# Patient Record
Sex: Male | Born: 1960
Health system: Southern US, Community
[De-identification: ages and names within clinical notes are randomized; demographics above are authoritative.]

## PROBLEM LIST (undated history)

## (undated) DIAGNOSIS — I639 Cerebral infarction, unspecified: Secondary | ICD-10-CM

## (undated) DIAGNOSIS — E119 Type 2 diabetes mellitus without complications: Secondary | ICD-10-CM

## (undated) DIAGNOSIS — I10 Essential (primary) hypertension: Secondary | ICD-10-CM

## (undated) DIAGNOSIS — H5440 Blindness, one eye, unspecified eye: Secondary | ICD-10-CM

## (undated) DIAGNOSIS — H3320 Serous retinal detachment, unspecified eye: Secondary | ICD-10-CM

---

## 2019-02-12 DIAGNOSIS — I517 Cardiomegaly: Secondary | ICD-10-CM | POA: Diagnosis not present

## 2019-02-12 DIAGNOSIS — I10 Essential (primary) hypertension: Secondary | ICD-10-CM

## 2019-02-12 DIAGNOSIS — I639 Cerebral infarction, unspecified: Secondary | ICD-10-CM | POA: Diagnosis not present

## 2019-02-12 DIAGNOSIS — N179 Acute kidney failure, unspecified: Secondary | ICD-10-CM | POA: Diagnosis not present

## 2019-02-12 DIAGNOSIS — E1165 Type 2 diabetes mellitus with hyperglycemia: Secondary | ICD-10-CM

## 2019-02-13 DIAGNOSIS — N179 Acute kidney failure, unspecified: Secondary | ICD-10-CM | POA: Diagnosis not present

## 2019-02-13 DIAGNOSIS — I639 Cerebral infarction, unspecified: Secondary | ICD-10-CM | POA: Diagnosis not present

## 2019-02-13 DIAGNOSIS — E1165 Type 2 diabetes mellitus with hyperglycemia: Secondary | ICD-10-CM | POA: Diagnosis not present

## 2019-02-14 DIAGNOSIS — N179 Acute kidney failure, unspecified: Secondary | ICD-10-CM | POA: Diagnosis not present

## 2019-02-14 DIAGNOSIS — I639 Cerebral infarction, unspecified: Secondary | ICD-10-CM | POA: Diagnosis not present

## 2019-02-14 DIAGNOSIS — E1165 Type 2 diabetes mellitus with hyperglycemia: Secondary | ICD-10-CM | POA: Diagnosis not present

## 2019-03-01 ENCOUNTER — Other Ambulatory Visit (HOSPITAL_COMMUNITY): Payer: Self-pay | Admitting: Interventional Radiology

## 2019-03-01 DIAGNOSIS — I63519 Cerebral infarction due to unspecified occlusion or stenosis of unspecified middle cerebral artery: Secondary | ICD-10-CM

## 2019-03-05 ENCOUNTER — Ambulatory Visit (HOSPITAL_COMMUNITY): Admission: RE | Admit: 2019-03-05 | Payer: No Typology Code available for payment source | Source: Ambulatory Visit

## 2019-03-14 ENCOUNTER — Emergency Department (HOSPITAL_COMMUNITY): Payer: Medicare Other

## 2019-03-14 ENCOUNTER — Other Ambulatory Visit: Payer: Self-pay

## 2019-03-14 ENCOUNTER — Inpatient Hospital Stay (HOSPITAL_COMMUNITY): Payer: Medicare Other

## 2019-03-14 ENCOUNTER — Ambulatory Visit (HOSPITAL_COMMUNITY): Admission: RE | Admit: 2019-03-14 | Payer: No Typology Code available for payment source | Source: Ambulatory Visit

## 2019-03-14 ENCOUNTER — Telehealth (HOSPITAL_COMMUNITY): Payer: Self-pay | Admitting: Radiology

## 2019-03-14 ENCOUNTER — Encounter (HOSPITAL_COMMUNITY): Payer: Self-pay | Admitting: Emergency Medicine

## 2019-03-14 ENCOUNTER — Inpatient Hospital Stay (HOSPITAL_COMMUNITY)
Admission: EM | Admit: 2019-03-14 | Discharge: 2019-03-16 | DRG: 065 | Disposition: A | Discharge status: Home / Self Care | Payer: Medicare Other | Attending: Internal Medicine | Admitting: Internal Medicine

## 2019-03-14 DIAGNOSIS — I129 Hypertensive chronic kidney disease with stage 1 through stage 4 chronic kidney disease, or unspecified chronic kidney disease: Secondary | ICD-10-CM | POA: Diagnosis not present

## 2019-03-14 DIAGNOSIS — Z8249 Family history of ischemic heart disease and other diseases of the circulatory system: Secondary | ICD-10-CM | POA: Diagnosis not present

## 2019-03-14 DIAGNOSIS — E860 Dehydration: Secondary | ICD-10-CM | POA: Diagnosis not present

## 2019-03-14 DIAGNOSIS — H5440 Blindness, one eye, unspecified eye: Secondary | ICD-10-CM | POA: Diagnosis present

## 2019-03-14 DIAGNOSIS — Z79899 Other long term (current) drug therapy: Secondary | ICD-10-CM

## 2019-03-14 DIAGNOSIS — H53132 Sudden visual loss, left eye: Secondary | ICD-10-CM | POA: Diagnosis not present

## 2019-03-14 DIAGNOSIS — H5340 Unspecified visual field defects: Secondary | ICD-10-CM | POA: Diagnosis present

## 2019-03-14 DIAGNOSIS — R29810 Facial weakness: Secondary | ICD-10-CM | POA: Diagnosis present

## 2019-03-14 DIAGNOSIS — H5461 Unqualified visual loss, right eye, normal vision left eye: Secondary | ICD-10-CM | POA: Diagnosis present

## 2019-03-14 DIAGNOSIS — E1122 Type 2 diabetes mellitus with diabetic chronic kidney disease: Secondary | ICD-10-CM | POA: Diagnosis present

## 2019-03-14 DIAGNOSIS — G8191 Hemiplegia, unspecified affecting right dominant side: Secondary | ICD-10-CM | POA: Diagnosis present

## 2019-03-14 DIAGNOSIS — N183 Chronic kidney disease, stage 3 (moderate): Secondary | ICD-10-CM | POA: Diagnosis present

## 2019-03-14 DIAGNOSIS — I10 Essential (primary) hypertension: Secondary | ICD-10-CM | POA: Diagnosis present

## 2019-03-14 DIAGNOSIS — H539 Unspecified visual disturbance: Secondary | ICD-10-CM | POA: Diagnosis not present

## 2019-03-14 DIAGNOSIS — Z8673 Personal history of transient ischemic attack (TIA), and cerebral infarction without residual deficits: Secondary | ICD-10-CM | POA: Diagnosis not present

## 2019-03-14 DIAGNOSIS — E785 Hyperlipidemia, unspecified: Secondary | ICD-10-CM | POA: Diagnosis present

## 2019-03-14 DIAGNOSIS — R29702 NIHSS score 2: Secondary | ICD-10-CM | POA: Diagnosis not present

## 2019-03-14 DIAGNOSIS — N289 Disorder of kidney and ureter, unspecified: Secondary | ICD-10-CM

## 2019-03-14 DIAGNOSIS — Z7982 Long term (current) use of aspirin: Secondary | ICD-10-CM | POA: Diagnosis not present

## 2019-03-14 DIAGNOSIS — Z1159 Encounter for screening for other viral diseases: Secondary | ICD-10-CM

## 2019-03-14 DIAGNOSIS — I639 Cerebral infarction, unspecified: Secondary | ICD-10-CM

## 2019-03-14 DIAGNOSIS — N179 Acute kidney failure, unspecified: Secondary | ICD-10-CM | POA: Diagnosis not present

## 2019-03-14 DIAGNOSIS — E119 Type 2 diabetes mellitus without complications: Secondary | ICD-10-CM

## 2019-03-14 DIAGNOSIS — R29701 NIHSS score 1: Secondary | ICD-10-CM | POA: Diagnosis not present

## 2019-03-14 DIAGNOSIS — E1159 Type 2 diabetes mellitus with other circulatory complications: Secondary | ICD-10-CM | POA: Diagnosis not present

## 2019-03-14 HISTORY — DX: Essential (primary) hypertension: I10

## 2019-03-14 HISTORY — DX: Type 2 diabetes mellitus without complications: E11.9

## 2019-03-14 HISTORY — DX: Blindness, one eye, unspecified eye: H54.40

## 2019-03-14 HISTORY — DX: Serous retinal detachment, unspecified eye: H33.20

## 2019-03-14 HISTORY — DX: Cerebral infarction, unspecified: I63.9

## 2019-03-14 LAB — CBC WITH DIFFERENTIAL/PLATELET
Abs Immature Granulocytes: 0.02 10*3/uL (ref 0.00–0.07)
Basophils Absolute: 0.1 10*3/uL (ref 0.0–0.1)
Basophils Relative: 1 %
Eosinophils Absolute: 0.2 10*3/uL (ref 0.0–0.5)
Eosinophils Relative: 2 %
HCT: 36.1 % — ABNORMAL LOW (ref 39.0–52.0)
Hemoglobin: 11.7 g/dL — ABNORMAL LOW (ref 13.0–17.0)
Immature Granulocytes: 0 %
Lymphocytes Relative: 28 %
Lymphs Abs: 2.1 10*3/uL (ref 0.7–4.0)
MCH: 27.4 pg (ref 26.0–34.0)
MCHC: 32.4 g/dL (ref 30.0–36.0)
MCV: 84.5 fL (ref 80.0–100.0)
Monocytes Absolute: 0.7 10*3/uL (ref 0.1–1.0)
Monocytes Relative: 10 %
Neutro Abs: 4.4 10*3/uL (ref 1.7–7.7)
Neutrophils Relative %: 59 %
Platelets: 276 10*3/uL (ref 150–400)
RBC: 4.27 MIL/uL (ref 4.22–5.81)
RDW: 13.1 % (ref 11.5–15.5)
WBC: 7.6 10*3/uL (ref 4.0–10.5)
nRBC: 0 % (ref 0.0–0.2)

## 2019-03-14 LAB — CBG MONITORING, ED: Glucose-Capillary: 100 mg/dL — ABNORMAL HIGH (ref 70–99)

## 2019-03-14 LAB — BASIC METABOLIC PANEL
Anion gap: 7 (ref 5–15)
BUN: 33 mg/dL — ABNORMAL HIGH (ref 6–20)
CO2: 21 mmol/L — ABNORMAL LOW (ref 22–32)
Calcium: 9.7 mg/dL (ref 8.9–10.3)
Chloride: 109 mmol/L (ref 98–111)
Creatinine, Ser: 2.37 mg/dL — ABNORMAL HIGH (ref 0.61–1.24)
GFR calc Af Amer: 34 mL/min — ABNORMAL LOW (ref >60)
GFR calc non Af Amer: 29 mL/min — ABNORMAL LOW (ref >60)
Glucose, Bld: 145 mg/dL — ABNORMAL HIGH (ref 70–99)
Potassium: 5.1 mmol/L (ref 3.5–5.1)
Sodium: 137 mmol/L (ref 135–145)

## 2019-03-14 LAB — PROTIME-INR
INR: 1 (ref 0.8–1.2)
Prothrombin Time: 12.9 seconds (ref 11.4–15.2)

## 2019-03-14 LAB — SARS CORONAVIRUS 2 BY RT PCR (HOSPITAL ORDER, PERFORMED IN ~~LOC~~ HOSPITAL LAB): SARS Coronavirus 2: NEGATIVE

## 2019-03-14 LAB — GLUCOSE, CAPILLARY: Glucose-Capillary: 140 mg/dL — ABNORMAL HIGH (ref 70–99)

## 2019-03-14 MED ORDER — ENOXAPARIN SODIUM 40 MG/0.4ML ~~LOC~~ SOLN: 40.0000 mg | SUBCUTANEOUS | Status: DC

## 2019-03-14 MED ORDER — ENSURE ENLIVE PO LIQD: 237.0000 mL | Freq: Two times a day (BID) | ORAL | Status: DC

## 2019-03-14 MED ORDER — CLOPIDOGREL BISULFATE 75 MG PO TABS
75.0000 mg | ORAL_TABLET | Freq: Every day | ORAL | Status: DC
  Administered 2019-03-15 – 2019-03-16 (×2): 75 mg via ORAL
  Filled 2019-03-14 (×2): qty 1

## 2019-03-14 MED ORDER — ACETAMINOPHEN 325 MG PO TABS: 650.0000 mg | ORAL_TABLET | ORAL | Status: DC | PRN

## 2019-03-14 MED ORDER — ACETAMINOPHEN 650 MG RE SUPP: 650.0000 mg | RECTAL | Status: DC | PRN

## 2019-03-14 MED ORDER — INSULIN ASPART 100 UNIT/ML ~~LOC~~ SOLN
0.0000 [IU] | Freq: Three times a day (TID) | SUBCUTANEOUS | Status: DC
  Administered 2019-03-15: 17:00:00 1 [IU] via SUBCUTANEOUS
  Administered 2019-03-15 – 2019-03-16 (×2): 2 [IU] via SUBCUTANEOUS

## 2019-03-14 MED ORDER — STROKE: EARLY STAGES OF RECOVERY BOOK
Freq: Once | Status: AC
  Administered 2019-03-14: 22:00:00
  Filled 2019-03-14: qty 1

## 2019-03-14 MED ORDER — ACETAMINOPHEN 160 MG/5ML PO SOLN: 650.0000 mg | ORAL | Status: DC | PRN

## 2019-03-14 MED ORDER — ASPIRIN EC 81 MG PO TBEC
81.0000 mg | DELAYED_RELEASE_TABLET | Freq: Every day | ORAL | Status: DC
  Administered 2019-03-15 – 2019-03-16 (×2): 81 mg via ORAL
  Filled 2019-03-14 (×2): qty 1

## 2019-03-14 MED ORDER — ATORVASTATIN CALCIUM 80 MG PO TABS
80.0000 mg | ORAL_TABLET | Freq: Every day | ORAL | Status: DC
  Administered 2019-03-14 – 2019-03-15 (×2): 80 mg via ORAL
  Filled 2019-03-14 (×2): qty 1

## 2019-03-14 MED ORDER — ENOXAPARIN SODIUM 40 MG/0.4ML ~~LOC~~ SOLN
40.0000 mg | SUBCUTANEOUS | Status: DC
  Filled 2019-03-14: qty 0.4

## 2019-03-14 MED ORDER — CLOPIDOGREL BISULFATE 75 MG PO TABS: 75.0000 mg | ORAL_TABLET | Freq: Every day | ORAL | Status: DC

## 2019-03-14 MED ORDER — ASPIRIN EC 81 MG PO TBEC: 81.0000 mg | DELAYED_RELEASE_TABLET | Freq: Every day | ORAL | Status: DC

## 2019-03-14 MED ORDER — CLOPIDOGREL BISULFATE 75 MG PO TABS
75.0000 mg | ORAL_TABLET | Freq: Every day | ORAL | Status: DC
  Administered 2019-03-14: 75 mg via ORAL
  Filled 2019-03-14: qty 1

## 2019-03-14 NOTE — ED Notes (Signed)
Transported to MRI

## 2019-03-14 NOTE — ED Notes (Signed)
Pt taken to MRI  

## 2019-03-14 NOTE — Consult Note (Addendum)
Neurology Consultation  Reason for Consult: Abnormal vision in the left eye  Referring Physician: Dr. Jodi MourningZavitz  History is obtained from: Patient and chart  HPI: Kerry Gilbert is a 58 y.o. male with history of right retinal detachment with complete blindness OD, stroke, diabetes and hypertension.  Patient was at Dr. Ardeth Perfecteborah Schwartz's office where he was supposed to be seen as a follow-up after having a stroke 3 weeks ago.  Patient states he had noticed vision changes starting 2 days ago.  He describes to me that he went to sleep at approximately 1:00 in the morning on Tuesday, 03/12/2019.  When he woke up at approximately 9 AM on the same day he noted that he saw the color "red out of his left eye--in the upper cresent region".  As noted above, he is blind in his right eye at baseline.  He also states that he saw "strings of red when looking out of his left eye".  Currently he states that he does not see that color, but has crescentic obscuration of vision in the left upper quadrant when looking out of that eye.  He notes that the crescentic defect returns at night but seems to go away during the day.  He states he has no other symptoms such as pain behind his eye, headache, inability to move his eyes, nasal congestion or any limb weakness or numbness.  He states he has never had this issue before.  LKW: 1 AM on 03/12/2019 tpa given?: no, out of window along with recent stroke Premorbid modified Rankin scale (mRS): 0 NIH stroke scale of 0  ROS: A 14 point ROS was performed and is negative except as noted in the HPI.   Past Medical History:  Diagnosis Date  . Blindness of right eye   . Detached retina   . Diabetes (HCC)   . Hypertension   . Stroke (cerebrum) [facility](HCC)     Family History  Problem Relation Age of Onset  . Hypertension Mother   . Hypertension Father    Social History:   has no history on file for tobacco, alcohol, and  drug.  Medications Amlodipine Plavix Lisinopril Lopressor Zocor  Exam: Current vital signs: BP (!) 165/75   Pulse 79   Temp 98.5 F (36.9 C) (Oral)   Resp (!) 9   SpO2 100%  Vital signs in last 24 hours: Temp:  [98.5 F (36.9 C)] 98.5 F (36.9 C) (05/28 1216) Pulse Rate:  [79-86] 79 (05/28 1245) Resp:  [9-20] 9 (05/28 1245) BP: (143-165)/(75-83) 165/75 (05/28 1245) SpO2:  [100 %] 100 % (05/28 1245)  Physical Exam  Constitutional: Appears well-developed and well-nourished.  Psych: Affect appropriate to situation Eyes: No scleral injection HENT: No OP obstrucion Head: Normocephalic.  Cardiovascular: Normal rate and regular rhythm.  Respiratory: Effort normal, non-labored breathing GI: Soft.  No distension. There is no tenderness.  Skin: WDI  Neuro: Mental Status: Patient is awake, alert, oriented to person, place, month, year, and situation. Patient is able to give a clear and coherent history. No signs of aphasia or neglect Cranial Nerves: II: Significant pallor of retina on right funduscopic exam. No pallor on the left, but difficult to discern vessels due to small pupil. Crescentic visual field cut in left upper quadrant of left eye. Chronically blind in his right eye. Left pupil round and reactive. RAPD on the right. Right pupil is irregular, oval-shaped and nonreactive. III,IV, VI: EOMI without ptosis or diplopia.   V: Facial sensation is symmetric  to temperature VII: Facial movement is symmetric.  VIII: hearing is intact to voice X: Palate elevates symmetrically XI: Shoulder shrug is symmetric. XII: Tongue is midline without atrophy or fasciculations.  Motor: Tone is normal. Bulk is normal. 5/5 strength was present in all four extremities.  Sensory: Sensation is symmetric to light touch and temperature in the arms and legs. Deep Tendon Reflexes: 2+ and symmetric in the biceps with no patellar reflex Plantars: Toes are downgoing bilaterally.  Cerebellar:  FNF and HKS are intact bilaterally  Labs I have reviewed labs in epic and the results pertinent to this consultation are:   CBC No results found for: WBC, RBC, HGB, HCT, PLT, MCV, MCH, MCHC, RDW, LYMPHSABS, MONOABS, EOSABS, BASOSABS  CMP  No results found for: NA, K, CL, CO2, GLUCOSE, BUN, CREATININE, CALCIUM, PROT, ALBUMIN, AST, ALT, ALKPHOS, BILITOT, GFRNONAA, GFRAA  Lipid Panel  No results found for: CHOL, TRIG, HDL, CHOLHDL, VLDL, LDLCALC, LDLDIRECT  Imaging I have reviewed the images obtained:  Felicie Morn PA-C [facility] [phone] 03/14/2019, 1:20 PM   Assessment: 58 year old male with history of prior right retinal detachment who presents with a 2 day history of crescentic visual field deficit in left upper quadrant of left eye.  1. DDx includes occipital lobe stroke and new right sided retinal detachment. The pattern of vision loss is not consistent with optic neuritis or CRAO. Lack of eye pain makes acute angle closure glaucoma less likely.   2. Ophthalmology consult, CTA of head and neck as well as MRI brain should be obtained.   Recommendations: - Agree with CTA of head and neck.  - MRI of brain - Ophthalmology consult for dilated retinal exam during this admission to rule out retinal detachment.   Dr. Otelia Limes to make adjustments on this note.

## 2019-03-14 NOTE — ED Provider Notes (Signed)
  Physical Exam  BP (!) 138/92   Pulse 90   Temp 98.5 F (36.9 C) (Oral)   Resp 20   SpO2 100%   Physical Exam  ED Course/Procedures     Procedures  MDM  Patient with visual field loss in left eye.  Already blind in right eye.  Reportedly had stroke at [facility] around 3 weeks ago.  States that he did not have any vision changes with this.  States he was told his vessel in his neck was narrow.  Reportedly was started on Plavix but is not started it yet.  MRI showed possible subacute stroke.  However where that could cause some left-sided vision changes.  Discussed with Dr. Otelia Limes.  States this could be the cause and should be admitted for further stroke work-up.       Benjiman Core, MD 03/14/19 1859

## 2019-03-14 NOTE — H&P (Signed)
History and Physical    Kerry Gilbert NWG:[id number] DOB: 03-25-1961 DOA: 03/14/2019  PCP: Patient, No Pcp Per  Patient coming from: Home  I have personally briefly reviewed patient's old medical records in [facility]  Chief Complaint: Vision loss  HPI: Kerry Gilbert is a 59 y.o. male with medical history significant of DM2, HTN.  Patient blind in R eye chronically due to detached retina.  Patient seen and worked up for acute stroke at [facility] x3 weeks ago, records not immediately available, but patient said no vision loss at that time.  Work up revealed "narrow artery in his neck".  Was supposed to be started on plavix but hadnt started taking that yet.  2 days ago had partial loss of vision in L eye.  Primarily loss of lateral vision.  Described as "wavy red lines", and "red out of his left eye--in the upper cresent region".   Symptoms constant since onset 2 days ago with no progression or worsening per patient.   ED Course: MRI brain shows whats suspected to be a late subacute stroke in the R thalamus.  Neoplasm less likely.   Review of Systems: As per HPI otherwise 10 point review of systems negative.   Past Medical History:  Diagnosis Date   Blindness of right eye    Detached retina    Diabetes (HCC)    Hypertension    Stroke (cerebrum) (HCC)     History reviewed. No pertinent surgical history.   reports that he has never smoked. He does not have any smokeless tobacco history on file. He reports that he does not drink alcohol. No history on file for drug.  No Known Allergies  Family History  Problem Relation Age of Onset   Hypertension Mother    Hypertension Father      Prior to Admission medications   Medication Sig Start Date End Date Taking? Authorizing Provider  aspirin EC 81 MG tablet Take 81 mg by mouth daily.   Yes [provider]  atorvastatin (LIPITOR) 80 MG tablet Take 80 mg by mouth at bedtime.   Yes [provider]   chlorthalidone (HYGROTON) 25 MG tablet Take 25 mg by mouth daily.   Yes [provider]  lisinopril (ZESTRIL) 40 MG tablet Take 20 mg by mouth daily.   Yes [provider]    Physical Exam: Vitals:   03/14/19 1615 03/14/19 1800 03/14/19 1830 03/14/19 1922  BP:  140/87 (!) 138/92 108/72  Pulse: 80 81 90 80  Resp: 20   16  Temp:      TempSrc:      SpO2: 100% 100% 100% 100%    Constitutional: NAD, calm, comfortable Eyes: Left pupil is 2 mm round and reactive, right pupil is irregular oval-shaped and nonreactive ENMT: Mucous membranes are moist. Posterior pharynx clear of any exudate or lesions.Normal dentition.  Neck: normal, supple, no masses, no thyromegaly Respiratory: clear to auscultation bilaterally, no wheezing, no crackles. Normal respiratory effort. No accessory muscle use.  Cardiovascular: Regular rate and rhythm, no murmurs / rubs / gallops. No extremity edema. 2+ pedal pulses. No carotid bruits.  Abdomen: no tenderness, no masses palpated. No hepatosplenomegaly. Bowel sounds positive.  Musculoskeletal: no clubbing / cyanosis. No joint deformity upper and lower extremities. Good ROM, no contractures. Normal muscle tone.  Skin: no rashes, lesions, ulcers. No induration Neurologic: CN 2-12 grossly intact. Sensation intact, DTR normal. Strength 5/5 in all 4.  Psychiatric: Normal judgment and insight. Alert and oriented x  3. Normal mood.    Labs on Admission: I have personally reviewed following labs and imaging studies  CBC: Recent Labs  Lab 03/14/19 1334  WBC 7.6  NEUTROABS 4.4  HGB 11.7*  HCT 36.1*  MCV 84.5  PLT 276   Basic Metabolic Panel: Recent Labs  Lab 03/14/19 1334  NA 137  K 5.1  CL 109  CO2 21*  GLUCOSE 145*  BUN 33*  CREATININE 2.37*  CALCIUM 9.7   GFR: CrCl cannot be calculated (Unknown ideal weight.). Liver Function Tests: No results for input(s): AST, ALT, ALKPHOS, BILITOT, PROT, ALBUMIN in the last 168 hours. No  results for input(s): LIPASE, AMYLASE in the last 168 hours. No results for input(s): AMMONIA in the last 168 hours. Coagulation Profile: Recent Labs  Lab 03/14/19 1334  INR 1.0   Cardiac Enzymes: No results for input(s): CKTOTAL, CKMB, CKMBINDEX, TROPONINI in the last 168 hours. BNP (last 3 results) No results for input(s): PROBNP in the last 8760 hours. HbA1C: No results for input(s): HGBA1C in the last 72 hours. CBG: Recent Labs  Lab 03/14/19 1932  GLUCAP 100*   Lipid Profile: No results for input(s): CHOL, HDL, LDLCALC, TRIG, CHOLHDL, LDLDIRECT in the last 72 hours. Thyroid Function Tests: No results for input(s): TSH, T4TOTAL, FREET4, T3FREE, THYROIDAB in the last 72 hours. Anemia Panel: No results for input(s): VITAMINB12, FOLATE, FERRITIN, TIBC, IRON, RETICCTPCT in the last 72 hours. Urine analysis: No results found for: COLORURINE, APPEARANCEUR, LABSPEC, PHURINE, GLUCOSEU, HGBUR, BILIRUBINUR, KETONESUR, PROTEINUR, UROBILINOGEN, NITRITE, LEUKOCYTESUR  Radiological Exams on Admission: Mr Brain Wo Contrast  Result Date: 03/14/2019 CLINICAL DATA:  History of retinal detachment on the right with blindness. Diabetes and hypertension. Vision worsening beginning 2 days ago. EXAM: MRI HEAD WITHOUT CONTRAST TECHNIQUE: Multiplanar, multiecho pulse sequences of the brain and surrounding structures were obtained without intravenous contrast. COMPARISON:  None. FINDINGS: Brain: Diffusion imaging does not show any acute or subacute infarction. No abnormality is seen affecting the brainstem or cerebellum. Both cerebral hemispheres show moderate chronic small-vessel ischemic changes of the white matter. There is an old small vessel infarction of the medial left thalamus. There is a lesion of the right thalamus measuring approximately 2 x 1.5 x 1 cm, with surrounding edema. This does not show susceptibility artifact. My initial assumption was that this was a subacute thalamic stroke/hemorrhage,  but the absence of signal loss on susceptibility weighted images makes thalamic hemorrhage quite unlikely. Perhaps this was a bland infarction. However, the possibility of tumor does exist and I would recommend study in this after contrast administration. Elsewhere, there is no hemorrhage, hydrocephalus or extra-axial collection. Vascular: Major vessels at the base of the brain show flow. Skull and upper cervical spine: Negative Sinuses/Orbits: Clear except for a retention cyst of the left maxillary sinus. 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 Orbit on the left appears normal. Other: None IMPRESSION: Background pattern of chronic small vessel disease of the cerebral hemispheric white matter and an old medial left thalamic infarction. Right thalamic lesion probably representing a late subacute infarction. This measures about 2 x 1.5 x 1 cm. There does not appear to be any blood products. Whereas I favor this represents a late subacute infarction, the possibility of a tumor does exist and I would suggest that we study this after gadolinium administration, though the likelihood of tumor is remote. Electronically Signed   By: Paulina Fusi M.D.   On: 03/14/2019 17:13    EKG: Independently reviewed.  Assessment/Plan  Principal Problem:   Vision, loss, sudden, left Active Problems:   Acute ischemic stroke (HCC)   HTN (hypertension)   Blindness of right eye   DM2 (diabetes mellitus, type 2) (HCC)   Renal insufficiency    1. Partial vision loss in L eye - late effect of stroke vs retinal detachment 1. Neuro consult in chart 2. After MRI, Dr. Otelia LimesLindzen notes that stroke is in an area that could cause vision disturbance. 3. Due to h/o retinal detachment in other eye, consulted Dr. Allena KatzPatel with Optho 1. Possible that this is retinal detachment 2. But usually retinal detachment progresses, and odd that it hasnt progressed in past 2 days 3. He took patient info and will be doing retinal exam tomorrow  morning in hospital 2. R Thalamic Stroke - 1. Stroke pathway 2. Tele monitor 3. 2d echo 4. cartoid duplex 5. ASA/plavix to start tomorrow afternoon (giving optho a chance to evaluate retina) 6. MRA 7. Unable to do Gadolinium contrast secondary to renal insufficiency. 3. HTN - 1. Hold home BP meds due to possibility of acute stroke 4. DM2 - 1. Sensitive SSI AC 5. Renal insufficiency - 1. Unclear baseline 2. [facility]  DVT prophylaxis: Lovenox Code Status: Full Family Communication: No family in room Disposition Plan: Home after admit Consults called: Neuro and Optho Admission status: Admit to inpatient  Severity of Illness: The appropriate patient status for this patient is INPATIENT. Inpatient status is judged to be reasonable and necessary in order to provide the required intensity of service to ensure the patient's safety. The patient's presenting symptoms, physical exam findings, and initial radiographic and laboratory data in the context of their chronic comorbidities is felt to place them at high risk for further clinical deterioration. Furthermore, it is not anticipated that the patient will be medically stable for discharge from the hospital within 2 midnights of admission. The following factors support the patient status of inpatient.   IP status for work up of acute vision loss in his last good eye.   * I certify that at the point of admission it is my clinical judgment that the patient will require inpatient hospital care spanning beyond 2 midnights from the point of admission due to high intensity of service, high risk for further deterioration and high frequency of surveillance required.*    Madisson Kulaga M. DO [facility]  How to contact the [facility] Attending or Consulting provider 7A - 7P or covering provider during after hours 7P -7A, for this patient?  1. Check the care team in [facility] and look for a) attending/consulting  [facility] provider listed and b) the [facility] team listed 2. Log into [email or web address]  Amion Physician Scheduling and messaging for groups and whole hospitals  On call and physician scheduling software for group practices, residents, hospitalists and other medical providers for call, clinic, rotation and shift schedules. OnCall Enterprise is a hospital-wide system for scheduling doctors and paging doctors on call. EasyPlot is for scientific plotting and data analysis.  [email or web address]  and use [facility]'s universal password to access. If you do not have the password, please contact the hospital operator.  3. Locate the [facility] provider you are looking for under [facility] and page to a number that you can be directly reached. 4. If you still have difficulty reaching the provider, please page the [facility] (Director on Call) for the Hospitalists listed on amion for assistance.  03/14/2019, 8:29 PM

## 2019-03-14 NOTE — ED Triage Notes (Signed)
Pt was brought by a tech from Dr. Fatima Sanger office where he was supposed to be seen as follow up after having a stroke 3 weeks ago was referred from [facility]. 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 Pt states he has no residual weakness from his two previous stroke that he knows of. Pt is alert and ox4.

## 2019-03-14 NOTE — ED Notes (Signed)
MD pickering performed bedside US of left eye.

## 2019-03-14 NOTE — ED Provider Notes (Signed)
[facility] EMERGENCY DEPARTMENT Provider Note   CSN: [id number] Arrival date & time: 03/14/19  1201    History   Chief Complaint Chief Complaint  Patient presents with  . Visual Field Change    HPI Kerry Gilbert is a 58 y.o. male.     Patient with history of stroke 3 weeks ago which resulted in blindness in his right eye presents with vision loss in the left eye 2 days ago.  Patient says worse lateral portion and he does see red color in his vision.  Patient denies any pain in his eye.  Patient denies any other new neurologic symptoms.  No headache fevers or vomiting.  No shortness of breath.     Past Medical History:  Diagnosis Date  . Blindness of right eye   . Detached retina   . Diabetes (HCC)   . Hypertension   . Stroke (cerebrum) (HCC)     There are no active problems to display for this patient.   History reviewed. No pertinent surgical history.      Home Medications    Prior to Admission medications   Medication Sig Start Date End Date Taking? Authorizing Provider  aspirin EC 81 MG tablet Take 81 mg by mouth daily.   Yes [provider]  atorvastatin (LIPITOR) 80 MG tablet Take 80 mg by mouth at bedtime.   Yes [provider]  chlorthalidone (HYGROTON) 25 MG tablet Take 25 mg by mouth daily.   Yes [provider]  lisinopril (ZESTRIL) 40 MG tablet Take 20 mg by mouth daily.   Yes [provider]    Family History Family History  Problem Relation Age of Onset  . Hypertension Mother   . Hypertension Father     Social History Social History   Tobacco Use  . Smoking status: Not on file  Substance Use Topics  . Alcohol use: Not on file  . Drug use: Not on file     Allergies   Patient has no known allergies.   Review of Systems Review of Systems  Constitutional: Negative for chills and fever.  HENT: Negative for congestion.   Eyes: Positive for visual disturbance.  Respiratory:  Negative for shortness of breath.   Cardiovascular: Negative for chest pain.  Gastrointestinal: Negative for abdominal pain and vomiting.  Genitourinary: Negative for dysuria and flank pain.  Musculoskeletal: Negative for back pain, neck pain and neck stiffness.  Skin: Negative for rash.  Neurological: Negative for light-headedness and headaches.     Physical Exam Updated Vital Signs BP (!) 165/75   Pulse 79   Temp 98.5 F (36.9 C) (Oral)   Resp (!) 9   SpO2 100%   Physical Exam Vitals signs and nursing note reviewed.  Constitutional:      Appearance: He is well-developed.  HENT:     Head: Normocephalic and atraumatic.  Eyes:     General:        Right eye: No discharge.        Left eye: No discharge.     Conjunctiva/sclera: Conjunctivae normal.  Neck:     Musculoskeletal: Normal range of motion and neck supple.     Trachea: No tracheal deviation.  Cardiovascular:     Rate and Rhythm: Normal rate and regular rhythm.  Pulmonary:     Effort: Pulmonary effort is normal.     Breath sounds: Normal breath sounds.  Abdominal:     General: There is no distension.  Palpations: Abdomen is soft.     Tenderness: There is no abdominal tenderness. There is no guarding.  Skin:    General: Skin is warm.     Findings: No rash.  Neurological:     Mental Status: He is alert and oriented to person, place, and time.     Comments: Patient has no vision in his right eye, no vision in lateral visual fields in his left eye and decreased vision in medial visual fields in his left eye.  Pupils equal 2 mm bilateral.  5+ strength upper and lower extremities equal bilateral sensation intact palpation bilateral.  Finger-nose intact.      ED Treatments / Results  Labs (all labs ordered are listed, but only abnormal results are displayed) Labs Reviewed  CBC WITH DIFFERENTIAL/PLATELET - Abnormal; Notable for the following components:      Result Value   Hemoglobin 11.7 (*)    HCT 36.1 (*)     All other components within normal limits  BASIC METABOLIC PANEL - Abnormal; Notable for the following components:   CO2 21 (*)    Glucose, Bld 145 (*)    BUN 33 (*)    Creatinine, Ser 2.37 (*)    GFR calc non Af Amer 29 (*)    GFR calc Af Amer 34 (*)    All other components within normal limits  SARS CORONAVIRUS 2 (HOSPITAL ORDER, PERFORMED IN [facility])  PROTIME-INR    EKG None  Radiology No results found.  Procedures Procedures (including critical care time)  Medications Ordered in ED Medications - No data to display   Initial Impression / Assessment and Plan / ED Course  I have reviewed the triage vital signs and the nursing notes.  Pertinent labs & imaging results that were available during my care of the patient were reviewed by me and considered in my medical decision making (see chart for details).       Patient presents for assessment from specialty office for possible stroke.  Patient does have decreased vision lateral visual fields and with history of stroke discussed as a possibility.  With patient seeing a red tinge through his visual field on the left discussed concern for intraocular pathology as well.  Plan for CT scans with and without contrast, neurology I discussed the case with for consult, blood work and monitoring in the ED.   Neurology recommended CT head however unable to use contrast due to kidney function.  CT scan without contrast ordered an MRI brain ordered by neurology.  Patient's care will be signed out to follow-up results discussed with neurology for final disposition and whether or not ophthalmology follow-up as needed.   Final Clinical Impressions(s) / ED Diagnoses   Final diagnoses:  Visual disturbance    ED Discharge Orders    None       Blane OharaZavitz, Kanav Kazmierczak, MD 03/14/19 [phone]

## 2019-03-14 NOTE — ED Notes (Signed)
ED TO INPATIENT HANDOFF REPORT  ED Nurse Name and Phone #:  Lucious Groves [phone]  S Name/Age/Gender Kerry Gilbert 58 y.o. male Room/Bed: TRACC/TRACC  Code Status   Code Status: Full Code  Home/SNF/Other Home {Patient oriented x4 Is this baseline? YES  Triage Complete: Triage complete  Chief Complaint L Eye Vision Changes; (pt from IR)  Triage Note Pt was brought by a tech from Dr. Fatima Sanger office where he was supposed to be seen as follow up after having a stroke 3 weeks ago was referred from [facility]. 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 Pt states he has no residual weakness from his two previous stroke that he knows of. Pt is alert and ox4.      Allergies No Known Allergies  Level of Care/Admitting Diagnosis ED Disposition    ED Disposition Condition Comment   Admit  Hospital Area: [facility] [[id number]]  Level of Care: Telemetry Medical [104]  Covid Evaluation: Confirmed COVID Negative  Diagnosis: Vision, loss, sudden, left [[id number]]  Admitting Physician: Wyvonnia Dusky  Attending Physician: Hillary Bow [phone]  Estimated length of stay: past midnight tomorrow  Certification:: I certify this patient will need inpatient services for at least 2 midnights  PT Class (Do Not Modify): Inpatient [101]  PT Acc Code (Do Not Modify): Private [1]       B Medical/Surgery History Past Medical History:  Diagnosis Date  . Blindness of right eye   . Detached retina   . Diabetes (HCC)   . Hypertension   . Stroke (cerebrum) [facility])    History reviewed. No pertinent surgical history.   A IV Location/Drains/Wounds Patient Lines/Drains/Airways Status   Active Line/Drains/Airways    Name:   Placement date:   Placement time:   Site:   Days:   Peripheral IV 03/14/19 Left Wrist   03/14/19    1335    Wrist   less than 1           Intake/Output Last 24 hours No intake or output data in the 24 hours ending 03/14/19 2016  Labs/Imaging Results for orders placed or performed during the hospital encounter of 03/14/19 (from the past 48 hour(s))  SARS Coronavirus 2 (CEPHEID - Performed in [facility] hospital lab), Hosp Order     Status: None   Collection Time: 03/14/19 12:42 PM  Result Value Ref Range   SARS Coronavirus 2 NEGATIVE NEGATIVE    Comment: (NOTE) If result is NEGATIVE SARS-CoV-2 target nucleic acids are NOT DETECTED. The SARS-CoV-2 RNA is generally detectable in upper and lower  respiratory specimens during the acute phase of infection. The lowest  concentration of SARS-CoV-2 viral copies this assay can detect is 250  copies / mL. A negative result does not preclude SARS-CoV-2 infection  and should not be used as the sole basis for treatment or other  patient management decisions.  A negative result may occur with  improper specimen collection / handling, submission of specimen other  than nasopharyngeal swab, presence of viral mutation(s) within the  areas targeted by this assay, and inadequate number of viral copies  (<250 copies / mL). A negative result must be combined with clinical  observations, patient history, and epidemiological information. If result is POSITIVE SARS-CoV-2 target nucleic acids are DETECTED. The SARS-CoV-2 RNA is generally detectable in  upper and lower  respiratory specimens dur ing the acute phase of infection.  Positive  results are indicative of active infection with SARS-CoV-2.  Clinical  correlation with patient history and other diagnostic information is  necessary to determine patient infection status.  Positive results do  not rule out bacterial infection or co-infection with other viruses. If result is PRESUMPTIVE POSTIVE SARS-CoV-2 nucleic acids MAY BE PRESENT.   A presumptive positive result was obtained on the submitted specimen  and confirmed on repeat  testing.  While 2019 novel coronavirus  (SARS-CoV-2) nucleic acids may be present in the submitted sample  additional confirmatory testing may be necessary for epidemiological  and / or clinical management purposes  to differentiate between  SARS-CoV-2 and other Sarbecovirus currently known to infect humans.  If clinically indicated additional testing with an alternate test  methodology [id number]) is advised. The SARS-CoV-2 RNA is generally  detectable in upper and lower respiratory sp ecimens during the acute  phase of infection. The expected result is Negative. Fact Sheet for Patients:  BoilerBrush.com.cy Fact Sheet for Healthcare Providers: https://pope.com/ This test is not yet approved or cleared by the [place] FDA and has been authorized for detection and/or diagnosis of SARS-CoV-2 by FDA under an Emergency Use Authorization (EUA).  This EUA will remain in effect (meaning this test can be used) for the duration of the COVID-19 declaration under Section 564(b)(1) of the Act, 21 U.S.C. section 360bbb-3(b)(1), unless the authorization is terminated or revoked sooner. Performed at [facility], [address] [place], [place] [address]   CBC with Differential     Status: Abnormal   Collection Time: 03/14/19  1:34 PM  Result Value Ref Range   WBC 7.6 4.0 - 10.5 K/uL   RBC 4.27 4.22 - 5.81 MIL/uL   Hemoglobin 11.7 (L) 13.0 - 17.0 g/dL   HCT 62.9 (L) 52.8 - 41.3 %   MCV 84.5 80.0 - 100.0 fL   MCH 27.4 26.0 - 34.0 pg   MCHC 32.4 30.0 - 36.0 g/dL   RDW 24.4 01.0 - 27.2 %   Platelets 276 150 - 400 K/uL   nRBC 0.0 0.0 - 0.2 %   Neutrophils Relative % 59 %   Neutro Abs 4.4 1.7 - 7.7 K/uL   Lymphocytes Relative 28 %   Lymphs Abs 2.1 0.7 - 4.0 K/uL   Monocytes Relative 10 %   Monocytes Absolute 0.7 0.1 - 1.0 K/uL   Eosinophils Relative 2 %   Eosinophils Absolute 0.2 0.0 - 0.5 K/uL   Basophils Relative 1 %   Basophils  Absolute 0.1 0.0 - 0.1 K/uL   Immature Granulocytes 0 %   Abs Immature Granulocytes 0.02 0.00 - 0.07 K/uL    Comment: Performed at [facility], [address] [place], [place] [address]  Basic metabolic panel     Status: Abnormal   Collection Time: 03/14/19  1:34 PM  Result Value Ref Range   Sodium 137 135 - 145 mmol/L   Potassium 5.1 3.5 - 5.1 mmol/L   Chloride 109 98 - 111 mmol/L   CO2 21 (L) 22 - 32 mmol/L   Glucose, Bld 145 (H) 70 - 99 mg/dL   BUN 33 (H) 6 - 20 mg/dL   Creatinine, Ser 4.03 (H) 0.61 - 1.24 mg/dL   Calcium 9.7 8.9 - 47.4 mg/dL   GFR calc non Af Amer 29 (L) >60 mL/min   GFR calc Af Amer 34 (L) >60 mL/min  Anion gap 7 5 - 15    Comment: Performed at [facility], [address] [place], [place] [address]  Protime-INR     Status: None   Collection Time: 03/14/19  1:34 PM  Result Value Ref Range   Prothrombin Time 12.9 11.4 - 15.2 seconds   INR 1.0 0.8 - 1.2    Comment: (NOTE) INR goal varies based on device and disease states. Performed at [facility], [address] [place], [place] [address]   CBG monitoring, ED     Status: Abnormal   Collection Time: 03/14/19  7:32 PM  Result Value Ref Range   Glucose-Capillary 100 (H) 70 - 99 mg/dL   Mr Brain Wo Contrast  Result Date: 03/14/2019 CLINICAL DATA:  History of retinal detachment on the right with blindness. Diabetes and hypertension. Vision worsening beginning 2 days ago. EXAM: MRI HEAD WITHOUT CONTRAST TECHNIQUE: Multiplanar, multiecho pulse sequences of the brain and surrounding structures were obtained without intravenous contrast. COMPARISON:  None. FINDINGS: Brain: Diffusion imaging does not show any acute or subacute infarction. No abnormality is seen affecting the brainstem or cerebellum. Both cerebral hemispheres show moderate chronic small-vessel ischemic changes of the white matter. There is an old small vessel infarction of the medial left thalamus. There is a lesion of the right  thalamus measuring approximately 2 x 1.5 x 1 cm, with surrounding edema. This does not show susceptibility artifact. My initial assumption was that this was a subacute thalamic stroke/hemorrhage, but the absence of signal loss on susceptibility weighted images makes thalamic hemorrhage quite unlikely. Perhaps this was a bland infarction. However, the possibility of tumor does exist and I would recommend study in this after contrast administration. Elsewhere, there is no hemorrhage, hydrocephalus or extra-axial collection. Vascular: Major vessels at the base of the brain show flow. Skull and upper cervical spine: Negative Sinuses/Orbits: Clear except for a retention cyst of the left maxillary sinus. 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 Orbit on the left appears normal. Other: None IMPRESSION: Background pattern of chronic small vessel disease of the cerebral hemispheric white matter and an old medial left thalamic infarction. Right thalamic lesion probably representing a late subacute infarction. This measures about 2 x 1.5 x 1 cm. There does not appear to be any blood products. Whereas I favor this represents a late subacute infarction, the possibility of a tumor does exist and I would suggest that we study this after gadolinium administration, though the likelihood of tumor is remote. Electronically Signed   By: Paulina Fusi M.D.   On: 03/14/2019 17:13    Pending Labs Unresulted Labs (From admission, onward)    Start     Ordered   03/15/19 0500  Hemoglobin A1c  Tomorrow morning,   R     03/14/19 2006   03/15/19 0500  Lipid panel  Tomorrow morning,   R    Comments:  Fasting    03/14/19 2006   03/14/19 1959  HIV antibody (Routine Testing)  Once,   R     03/14/19 2006          Vitals/Pain Today's Vitals   03/14/19 1800 03/14/19 1830 03/14/19 1922 03/14/19 1923  BP: 140/87 (!) 138/92 108/72   Pulse: 81 90 80   Resp:   16   Temp:      TempSrc:      SpO2: 100% 100% 100%   PainSc:    0-No pain 0-No pain    Isolation  Precautions No active isolations  Medications Medications  atorvastatin (LIPITOR) tablet 80 mg (has no administration in time range)  aspirin EC tablet 81 mg (has no administration in time range)   stroke: mapping our early stages of recovery book (has no administration in time range)  acetaminophen (TYLENOL) tablet 650 mg (has no administration in time range)    Or  acetaminophen (TYLENOL) solution 650 mg (has no administration in time range)    Or  acetaminophen (TYLENOL) suppository 650 mg (has no administration in time range)  clopidogrel (PLAVIX) tablet 75 mg (has no administration in time range)  enoxaparin (LOVENOX) injection 40 mg (has no administration in time range)    Mobility walks Low fall risk   Focused Assessments   R Recommendations: See Admitting Provider Note  Report given to:   Additional Notes:

## 2019-03-14 NOTE — Telephone Encounter (Signed)
Called pt, left VM @ 0849 to see if pt could come at 1200 instead of 1300 today for his consult visit with Dr. Corliss Skains. Asked pt to call either way to let us know. JM

## 2019-03-14 NOTE — ED Notes (Signed)
This rn attempted blood draw and IV x2, unable to get either will ask another rn and consult IV team if needed.

## 2019-03-14 NOTE — ED Notes (Signed)
ED Provider at bedside. 

## 2019-03-14 NOTE — ED Notes (Signed)
Spoke with MD pickering regarding plan of care- states waiting to hear from neuro. Patient updated on reason for delay.

## 2019-03-15 ENCOUNTER — Inpatient Hospital Stay (HOSPITAL_COMMUNITY): Payer: Medicare Other

## 2019-03-15 ENCOUNTER — Encounter (HOSPITAL_COMMUNITY): Payer: Self-pay | Admitting: *Deleted

## 2019-03-15 DIAGNOSIS — H539 Unspecified visual disturbance: Secondary | ICD-10-CM | POA: Diagnosis not present

## 2019-03-15 DIAGNOSIS — E1159 Type 2 diabetes mellitus with other circulatory complications: Secondary | ICD-10-CM | POA: Diagnosis not present

## 2019-03-15 DIAGNOSIS — N289 Disorder of kidney and ureter, unspecified: Secondary | ICD-10-CM | POA: Diagnosis not present

## 2019-03-15 DIAGNOSIS — I639 Cerebral infarction, unspecified: Secondary | ICD-10-CM

## 2019-03-15 DIAGNOSIS — H53132 Sudden visual loss, left eye: Secondary | ICD-10-CM

## 2019-03-15 DIAGNOSIS — H5440 Blindness, one eye, unspecified eye: Secondary | ICD-10-CM

## 2019-03-15 DIAGNOSIS — I10 Essential (primary) hypertension: Secondary | ICD-10-CM

## 2019-03-15 LAB — LIPID PANEL
Cholesterol: 88 mg/dL (ref 0–200)
HDL: 26 mg/dL — ABNORMAL LOW (ref >40)
LDL Cholesterol: 46 mg/dL (ref 0–99)
Total CHOL/HDL Ratio: 3.4 RATIO
Triglycerides: 79 mg/dL (ref <150)
VLDL: 16 mg/dL (ref 0–40)

## 2019-03-15 LAB — URINALYSIS, ROUTINE W REFLEX MICROSCOPIC
Bacteria, UA: NONE SEEN
Bilirubin Urine: NEGATIVE
Glucose, UA: NEGATIVE mg/dL
Ketones, ur: NEGATIVE mg/dL
Leukocytes,Ua: NEGATIVE
Nitrite: NEGATIVE
Protein, ur: 100 mg/dL — AB
Specific Gravity, Urine: 1.015 (ref 1.005–1.030)
pH: 5 (ref 5.0–8.0)

## 2019-03-15 LAB — ECHOCARDIOGRAM COMPLETE BUBBLE STUDY
Height: 69 in
Weight: 4190.5 oz

## 2019-03-15 LAB — GLUCOSE, CAPILLARY
Glucose-Capillary: 102 mg/dL — ABNORMAL HIGH (ref 70–99)
Glucose-Capillary: 153 mg/dL — ABNORMAL HIGH (ref 70–99)
Glucose-Capillary: 142 mg/dL — ABNORMAL HIGH (ref 70–99)
Glucose-Capillary: 116 mg/dL — ABNORMAL HIGH (ref 70–99)

## 2019-03-15 LAB — BASIC METABOLIC PANEL
Anion gap: 9 (ref 5–15)
BUN: 29 mg/dL — ABNORMAL HIGH (ref 6–20)
CO2: 19 mmol/L — ABNORMAL LOW (ref 22–32)
Calcium: 9.3 mg/dL (ref 8.9–10.3)
Chloride: 110 mmol/L (ref 98–111)
Creatinine, Ser: 2.07 mg/dL — ABNORMAL HIGH (ref 0.61–1.24)
GFR calc Af Amer: 40 mL/min — ABNORMAL LOW (ref >60)
GFR calc non Af Amer: 35 mL/min — ABNORMAL LOW (ref >60)
Glucose, Bld: 118 mg/dL — ABNORMAL HIGH (ref 70–99)
Potassium: 4.4 mmol/L (ref 3.5–5.1)
Sodium: 138 mmol/L (ref 135–145)

## 2019-03-15 LAB — HEMOGLOBIN A1C
Hgb A1c MFr Bld: 7.2 % — ABNORMAL HIGH (ref 4.8–5.6)
Mean Plasma Glucose: 159.94 mg/dL

## 2019-03-15 LAB — HIV ANTIBODY (ROUTINE TESTING W REFLEX): HIV Screen 4th Generation wRfx: NONREACTIVE

## 2019-03-15 MED ORDER — GLUCERNA SHAKE PO LIQD
237.0000 mL | Freq: Every day | ORAL | Status: DC
  Administered 2019-03-15: 237 mL via ORAL

## 2019-03-15 MED ORDER — SODIUM CHLORIDE 0.9 % IV SOLN
INTRAVENOUS | Status: DC
  Administered 2019-03-15 – 2019-03-16 (×2): via INTRAVENOUS

## 2019-03-15 NOTE — Progress Notes (Deleted)
  Echocardiogram 2D Echocardiogram has been performed.  Janani Chamber L Androw 03/15/2019, 11:27 AM

## 2019-03-15 NOTE — Evaluation (Signed)
Physical Therapy Evaluation Patient Details Name: Kerry Gilbert MRN: [id number] DOB: January 24, 1961 Today's Date: 03/15/2019   History of Present Illness  Kerry Gilbert is a 58 y.o. year old male with medical history significant for CVA x2 (09/2014, 02/2019), right retinal detachment with complete blindness (2018) HTN, type 2 diabetes, HLD who presented on 03/14/2019 ED with sudden vision change in his left eye (increased redness in upper left visual field of left eye, followed by defect in vision on left eye.MRI revealed subacute infarct in R thalamus.   Clinical Impression  Pt admitted secondary to problem above with deficits below. Pt reporting L eye visual deficits had resolved. Mild unsteadiness noted during mobility with cane, and pt required min guard A. Practiced gait and stair navigation. Pt reports friends can check on pt as needed. Will continue to follow acutely to maximize functional mobility independence and safety.     Follow Up Recommendations Home health PT;Supervision - Intermittent    Equipment Recommendations  None recommended by PT    Recommendations for Other Services       Precautions / Restrictions Precautions Precautions: Fall Restrictions Weight Bearing Restrictions: No      Mobility  Bed Mobility               General bed mobility comments: pt sitting in recliner upon arrival  Transfers Overall transfer level: Needs assistance Equipment used: Straight cane Transfers: Sit to/from Stand Sit to Stand: Min guard         General transfer comment: Min guard for steadying assist.   Ambulation/Gait Ambulation/Gait assistance: Min guard Gait Distance (Feet): 150 Feet Assistive device: Straight cane Gait Pattern/deviations: Step-through pattern;Decreased stride length Gait velocity: Decreased   General Gait Details: Slow, waddle type gait. Pt with mild unsteadiness, however, no overt LOB noted. Did tend to run into objects on his R, however, pt with  baseline R eye blindness. Educated to perform visual scanning tasks in unfamiliar environments.   Stairs Stairs: Yes Stairs assistance: Min guard Stair Management: Two rails;Alternating pattern;Step to pattern;Forwards Number of Stairs: 4 General stair comments: Overall steady with stair navigation. Educated about using step to pattern for descending steps to increase safety. Required min guard for safety.   Wheelchair Mobility    Modified Rankin (Stroke Patients Only) Modified Rankin (Stroke Patients Only) Pre-Morbid Rankin Score: No significant disability Modified Rankin: Moderately severe disability     Balance Overall balance assessment: Needs assistance Sitting-balance support: No upper extremity supported;Feet supported Sitting balance-Leahy Scale: Good     Standing balance support: Single extremity supported;During functional activity Standing balance-Leahy Scale: Poor Standing balance comment: prefers support of single UE during standing                             Pertinent Vitals/Pain Pain Assessment: No/denies pain    Home Living Family/patient expects to be discharged to:: Private residence Living Arrangements: Alone Available Help at Discharge: Friend(s);Available 24 hours/day Type of Home: House Home Access: Stairs to enter Entrance Stairs-Rails: Can reach both Entrance Stairs-Number of Steps: 5 Home Layout: One level Home Equipment: Cane - single point      Prior Function Level of Independence: Independent with assistive device(s)         Comments: Uses cane for outdoor mobility, otherwise independent.      Hand Dominance   Dominant Hand: Right    Extremity/Trunk Assessment   Upper Extremity Assessment Upper Extremity Assessment: Defer to OT evaluation  Lower Extremity Assessment Lower Extremity Assessment: Generalized weakness    Cervical / Trunk Assessment Cervical / Trunk Assessment: Normal  Communication    Communication: No difficulties  Cognition Arousal/Alertness: Awake/alert Behavior During Therapy: WFL for tasks assessed/performed Overall Cognitive Status: Within Functional Limits for tasks assessed                                        General Comments      Exercises     Assessment/Plan    PT Assessment Patient needs continued PT services  PT Problem List Decreased strength;Decreased balance;Decreased mobility;Decreased knowledge of precautions       PT Treatment Interventions DME instruction;Gait training;Stair training;Functional mobility training;Therapeutic activities;Therapeutic exercise;Balance training;Patient/family education    PT Goals (Current goals can be found in the Care Plan section)  Acute Rehab PT Goals Patient Stated Goal: to go home PT Goal Formulation: With patient Time For Goal Achievement: 03/29/19 Potential to Achieve Goals: Good    Frequency Min 4X/week   Barriers to discharge Decreased caregiver support      Co-evaluation               AM-PAC PT "6 Clicks" Mobility  Outcome Measure Help needed turning from your back to your side while in a flat bed without using bedrails?: None Help needed moving from lying on your back to sitting on the side of a flat bed without using bedrails?: A Little Help needed moving to and from a bed to a chair (including a wheelchair)?: A Little Help needed standing up from a chair using your arms (e.g., wheelchair or bedside chair)?: A Little Help needed to walk in hospital room?: A Little Help needed climbing 3-5 steps with a railing? : A Little 6 Click Score: 19    End of Session Equipment Utilized During Treatment: Gait belt Activity Tolerance: Patient tolerated treatment well Patient left: in chair;with call bell/phone within reach;with chair alarm set Nurse Communication: Mobility status PT Visit Diagnosis: Unsteadiness on feet (R26.81);Muscle weakness (generalized) (M62.81)     Time: 4970-2637 PT Time Calculation (min) (ACUTE ONLY): 15 min   Charges:   PT Evaluation $PT Eval Low Complexity: 1 Low          Gladys Damme, PT, DPT  Acute Rehabilitation Services  Pager: [phone] Office: [phone]   Lehman Prom 03/15/2019, 6:15 PM

## 2019-03-15 NOTE — Progress Notes (Signed)
Occupational Therapy Evaluation Patient Details Name: Kerry Gilbert MRN: [id number] DOB: 1961-07-14 Today's Date: 03/15/2019    History of Present Illness Kerry Gilbert is a 58 y.o. year old male with medical history significant for CVA x2 (09/2014, 02/2019), right retinal detachment with complete blindness (2018) HTN, type 2 diabetes, HLD who presented on 03/14/2019 ED with sudden vision change in his left eye (increased redness in upper left visual field of left eye, followed by defect in vision on left eye.   Clinical Impression   PTA, pt was living at home alone, and reports he was independent with ADL/IADL (driving, grocery shopping) and functional mobility with use of SPC. Pt currently requires minguard-minA for ADL and functional mobility at spc level. Noted minor LOB, pt able to self correct stating "that's why I use the cane". Pt reports he feels stronger than before and reports his vision in his left eye is back to normal. Due to decline in current level of function, pt would benefit from acute OT to address established goals to facilitate safe D/C to venue listed below. At this time, recommend HHOT follow-up. Will continue to follow acutely.     Follow Up Recommendations  Home health OT;Supervision - Intermittent    Equipment Recommendations  3 in 1 bedside commode    Recommendations for Other Services       Precautions / Restrictions Precautions Precautions: Fall Restrictions Weight Bearing Restrictions: No      Mobility Bed Mobility               General bed mobility comments: pt sitting in recliner upon arrival  Transfers Overall transfer level: Needs assistance Equipment used: Straight cane Transfers: Sit to/from Stand Sit to Stand: Min guard         General transfer comment: noted minor LOB, pt able to self-correct stating "this is why i use my cane"    Balance Overall balance assessment: Needs assistance Sitting-balance support: No upper extremity  supported;Feet supported Sitting balance-Leahy Scale: Good     Standing balance support: Single extremity supported;During functional activity Standing balance-Leahy Scale: Fair Standing balance comment: prefers support of single UE during standing                           ADL either performed or assessed with clinical judgement   ADL Overall ADL's : At baseline;Needs assistance/impaired Eating/Feeding: Set up;Sitting   Grooming: Supervision/safety;Standing   Upper Body Bathing: Min guard   Lower Body Bathing: Min guard;Sit to/from stand   Upper Body Dressing : Min guard;Standing   Lower Body Dressing: Min guard;Sit to/from stand   Toilet Transfer: Min guard;Ambulation Toilet Transfer Details (indicate cue type and reason): pt simulated to/from recliner Toileting- Clothing Manipulation and Hygiene: Min guard;Sit to/from stand   Tub/ Engineer, structural: International aid/development worker Details (indicate cue type and reason): minA for stability to clear simulated tub ledge Functional mobility during ADLs: Min guard;Cane General ADL Comments: pt reports he is at baseline level;pt requires closeminguard-minA for higher dynamic level balance     Vision Baseline Vision/History: Legally blind;Wears glasses(legally blind in righ eye) Wears Glasses: Reading only Patient Visual Report: No change from baseline(pt reports his vision is back to "normal") Vision Assessment?: Yes Eye Alignment: Within Functional Limits Ocular Range of Motion: Within Functional Limits Alignment/Gaze Preference: Within Defined Limits Tracking/Visual Pursuits: Decreased smoothness of horizontal tracking;Decreased smoothness of vertical tracking;Requires cues, head turns, or add eye shifts to track Visual Fields: No  apparent deficits Additional Comments: pt reports his vision is back to "normal";vision assessment for left eye only      Perception     Praxis      Pertinent Vitals/Pain  Pain Assessment: No/denies pain     Hand Dominance Right   Extremity/Trunk Assessment Upper Extremity Assessment Upper Extremity Assessment: Overall WFL for tasks assessed   Lower Extremity Assessment Lower Extremity Assessment: Defer to PT evaluation   Cervical / Trunk Assessment Cervical / Trunk Assessment: Normal   Communication Communication Communication: No difficulties   Cognition Arousal/Alertness: Awake/alert Behavior During Therapy: WFL for tasks assessed/performed Overall Cognitive Status: Within Functional Limits for tasks assessed                                     General Comments  VSS throughout session;educated pt on BE FAST accronym written in stroke booklet with pt's permission.Discussed and provided pt with handout for services for the blind.     Exercises     Shoulder Instructions      Home Living Family/patient expects to be discharged to:: Private residence Living Arrangements: Alone Available Help at Discharge: Friend(s);Available 24 hours/day Type of Home: House Home Access: Stairs to enter Entergy CorporationEntrance Stairs-Number of Steps: 5 Entrance Stairs-Rails: Can reach both Home Layout: One level     Bathroom Shower/Tub: Chief Strategy OfficerTub/shower unit   Bathroom Toilet: Standard Bathroom Accessibility: Yes How Accessible: Accessible via walker Home Equipment: Cane - quad          Prior Functioning/Environment Level of Independence: Independent with assistive device(s)        Comments: driving;shopped for groceries;able to walk a full lap around grocery store with support of cart;        OT Problem List: Decreased activity tolerance;Impaired balance (sitting and/or standing);Impaired vision/perception      OT Treatment/Interventions: Self-care/ADL training;Therapeutic activities;Patient/family education;Balance training;Visual/perceptual remediation/compensation    OT Goals(Current goals can be found in the care plan section) Acute Rehab  OT Goals Patient Stated Goal: to go home OT Goal Formulation: With patient Time For Goal Achievement: 03/29/19 Potential to Achieve Goals: Good ADL Goals Pt Will Perform Grooming: with modified independence;standing Pt Will Transfer to Toilet: with modified independence;ambulating Pt Will Perform Tub/Shower Transfer: with modified independence Additional ADL Goal #1: Pt will independently verbalize BE FAST acronym.  OT Frequency: Min 2X/week   Barriers to D/C:            Co-evaluation              AM-PAC OT "6 Clicks" Daily Activity     Outcome Measure Help from another person eating meals?: None Help from another person taking care of personal grooming?: A Little Help from another person toileting, which includes using toliet, bedpan, or urinal?: A Little Help from another person bathing (including washing, rinsing, drying)?: A Little Help from another person to put on and taking off regular upper body clothing?: A Little Help from another person to put on and taking off regular lower body clothing?: A Little 6 Click Score: 19   End of Session Equipment Utilized During Treatment: Gait belt;Other (comment)(spc) Nurse Communication: Mobility status  Activity Tolerance: Patient tolerated treatment well Patient left: in chair;with call bell/phone within reach;with chair alarm set  OT Visit Diagnosis: Other abnormalities of gait and mobility (R26.89);Low vision, both eyes (H54.2)  Time: 1427-1450 OT Time Calculation (min): 23 min Charges:  OT General Charges $OT Visit: 1 Visit OT Evaluation $OT Eval Low Complexity: 1 Low OT Treatments $Self Care/Home Management : 8-22 mins  Diona Browner OTR/L Acute Rehabilitation Services Office: [phone]   Rebeca Alert 03/15/2019, 3:12 PM

## 2019-03-15 NOTE — Progress Notes (Signed)
STROKE TEAM PROGRESS NOTE   HISTORY OF PRESENT ILLNESS (perDr Otelia Limes) Kerry Gilbert is a 58 y.o. male with history of right retinal detachment with complete blindness OD, stroke, diabetes and hypertension.  Patient was sent to the ER by Dr. Ardeth Perfect office, where he was supposed to be seen as a follow-up after having a stroke 3 weeks ago.   Patient states he had noticed vision changes in the left eye 2 days PTA. He noted that he saw the color "red out of his left eye--in the upper cresent region" as well as "strings of red when looking out of his left eye". He notes that the crescentic defect returns at night but seems to go away during the day.  He states he has no other symptoms such as pain behind his eye, headache, inability to move his eyes, nasal congestion or any limb weakness or numbness.   SUBJECTIVE (INTERVAL HISTORY) His RN is at the bedside. Currently he states that he does not see that color, but has crescentic obscuration of vision in the left upper quadrant when looking out of that eye.  MRI done at [facility] shows right thalamic hyperintensity which is likely a late subacute infarct.  I have reviewed MRI scan done at [facility] 3 weeks ago and this infarct was acute at that time. OBJECTIVE Vitals:   03/15/19 0130 03/15/19 0315 03/15/19 0900 03/15/19 1133  BP: (!) 142/70 130/74 123/81 (!) 141/87  Pulse: 78 84 95 88  Resp: Temp: 98 F (36.7 C) 98 F (36.7 C) 98.4 F (36.9 C) 98.6 F (37 C)  TempSrc: Oral Oral Oral Oral  SpO2: 100% 99% 99% 100%  Weight:      Height:        CBC:  Recent Labs  Lab 03/14/19 1334  WBC 7.6  NEUTROABS 4.4  HGB 11.7*  HCT 36.1*  MCV 84.5  PLT 276    Basic Metabolic Panel:  Recent Labs  Lab 03/14/19 1334 03/15/19 0422  NA 137 138  K 5.1 4.4  CL 109 110  CO2 21* 19*  GLUCOSE 145* 118*  BUN 33* 29*  CREATININE 2.37* 2.07*  CALCIUM 9.7 9.3    Lipid Panel:     Component Value Date/Time    CHOL 88 03/15/2019 0422   TRIG 79 03/15/2019 0422   HDL 26 (L) 03/15/2019 0422   CHOLHDL 3.4 03/15/2019 0422   VLDL 16 03/15/2019 0422   LDLCALC 46 03/15/2019 0422   HgbA1c:  Lab Results  Component Value Date   HGBA1C 7.2 (H) 03/15/2019   Urine Drug Screen: No results found for: LABOPIA, COCAINSCRNUR, LABBENZ, AMPHETMU, THCU, LABBARB  Alcohol Level No results found for: [facility]  IMAGING   Dg Chest 2 View  Result Date: 03/15/2019 CLINICAL DATA:  Stroke 3 weeks ago EXAM: CHEST - 2 VIEW COMPARISON:  None. FINDINGS: Cardiac shadows within normal limits. The lungs are clear bilaterally. No focal infiltrate or effusion is seen. No acute bony abnormality is noted. IMPRESSION: No active cardiopulmonary disease. Electronically Signed   By: Alcide Clever M.D.   On: 03/15/2019 00:10   Mr Brain Wo Contrast  Result Date: 03/14/2019 CLINICAL DATA:  History of retinal detachment on the right with blindness. Diabetes and hypertension. Vision worsening beginning 2 days ago. EXAM: MRI HEAD WITHOUT CONTRAST TECHNIQUE: Multiplanar, multiecho pulse sequences of the brain and surrounding structures were obtained without intravenous contrast. COMPARISON:  None. FINDINGS: Brain: Diffusion imaging does not  show any acute or subacute infarction. No abnormality is seen affecting the brainstem or cerebellum. Both cerebral hemispheres show moderate chronic small-vessel ischemic changes of the white matter. There is an old small vessel infarction of the medial left thalamus. There is a lesion of the right thalamus measuring approximately 2 x 1.5 x 1 cm, with surrounding edema. This does not show susceptibility artifact. My initial assumption was that this was a subacute thalamic stroke/hemorrhage, but the absence of signal loss on susceptibility weighted images makes thalamic hemorrhage quite unlikely. Perhaps this was a bland infarction. However, the possibility of tumor does exist and I would recommend study in this after  contrast administration. Elsewhere, there is no hemorrhage, hydrocephalus or extra-axial collection. Vascular: Major vessels at the base of the brain show flow. Skull and upper cervical spine: Negative Sinuses/Orbits: Clear except for a retention cyst of the left maxillary sinus. 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 Orbit on the left appears normal. Other: None IMPRESSION: Background pattern of chronic small vessel disease of the cerebral hemispheric white matter and an old medial left thalamic infarction. Right thalamic lesion probably representing a late subacute infarction. This measures about 2 x 1.5 x 1 cm. There does not appear to be any blood products. Whereas I favor this represents a late subacute infarction, the possibility of a tumor does exist and I would suggest that we study this after gadolinium administration, though the likelihood of tumor is remote. Electronically Signed   By: Paulina FusiMark  Shogry M.D.   On: 03/14/2019 17:13   Mr Maxine GlennMra Head Wo Contrast  Result Date: 03/14/2019 CLINICAL DATA:  Acute presentation with visual disturbance. Abnormal MRI scan earlier. EXAM: MRA HEAD WITHOUT CONTRAST TECHNIQUE: Angiographic images of the Circle of Willis were obtained using MRA technique without intravenous contrast. COMPARISON:  MRI same day FINDINGS: Both internal carotid arteries are patent through the skull base and siphon regions without stenosis. The anterior and middle cerebral vessels appear normal and patent without proximal stenosis, aneurysm or vascular malformation. Patent posterior communicating arteries on each side. No antegrade flow is seen in the left vertebral artery. The right vertebral artery shows moderate atherosclerotic irregularity but is patent to the basilar. The basilar artery shows moderate atherosclerotic irregularity without dominant stenosis. Flow is present in both posterior cerebral arteries, but the vessels appear markedly irregular. IMPRESSION: No significant  anterior circulation finding. Severely diseased posterior circulation. Occluded left vertebral artery. Atherosclerotic irregularity of the distal right vertebral artery and of the basilar artery. Pronounced atherosclerotic irregularity of the posterior circulation branch vessels, particularly the posterior cerebral arteries. Both posterior cerebral arteries do receive considerable supply from the anterior circulation Electronically Signed   By: Paulina FusiMark  Shogry M.D.   On: 03/14/2019 21:36   [facility] [place] [facility]  Result Date: 03/15/2019 Carotid Arterial Duplex Study Indications:  CVA. Risk Factors: Hypertension, Diabetes. Performing Technologist: Blanch MediaMegan Riddle RVS  Examination Guidelines: A complete evaluation includes B-mode imaging, spectral Doppler, color Doppler, and power Doppler as needed of all accessible portions of each vessel. Bilateral testing is considered an integral part of a complete examination. Limited examinations for reoccurring indications may be performed as noted.  Right Carotid Findings: +----------+--------+--------+--------+------------+--------+           PSV cm/sEDV cm/sStenosisDescribe    Comments +----------+--------+--------+--------+------------+--------+ CCA Prox  106     19              heterogenous         +----------+--------+--------+--------+------------+--------+  CCA Distal75      19              heterogenous         +----------+--------+--------+--------+------------+--------+ ICA Prox  63      21      1-39%   heterogenous         +----------+--------+--------+--------+------------+--------+ ICA Distal82      27                                   +----------+--------+--------+--------+------------+--------+ ECA       75      11                                   +----------+--------+--------+--------+------------+--------+ +----------+--------+-------+--------+-------------------+           PSV cm/sEDV cmsDescribeArm  Pressure (mmHG) +----------+--------+-------+--------+-------------------+ [id number]                                         +----------+--------+-------+--------+-------------------+ +---------+--------+--+--------+--+---------+ VertebralPSV cm/s37EDV cm/s11Antegrade +---------+--------+--+--------+--+---------+  Left Carotid Findings: +----------+--------+--------+--------+------------+--------+           PSV cm/sEDV cm/sStenosisDescribe    Comments +----------+--------+--------+--------+------------+--------+ CCA Prox  118     18              heterogenous         +----------+--------+--------+--------+------------+--------+ CCA Distal54      20              heterogenous         +----------+--------+--------+--------+------------+--------+ ICA Prox  74      31      1-39%   heterogenous         +----------+--------+--------+--------+------------+--------+ ICA Distal57      23                                   +----------+--------+--------+--------+------------+--------+ ECA       107     17                                   +----------+--------+--------+--------+------------+--------+ +----------+--------+--------+--------+-------------------+ SubclavianPSV cm/sEDV cm/sDescribeArm Pressure (mmHG) +----------+--------+--------+--------+-------------------+           69                                          +----------+--------+--------+--------+-------------------+ +---------+--------+--+--------+-+----------+ VertebralPSV cm/s49EDV cm/s8Retrograde +---------+--------+--+--------+-+----------+  Summary: Right Carotid: Velocities in the right ICA are consistent with a 1-39% stenosis. Left Carotid: Velocities in the left ICA are consistent with a 1-39% stenosis. Vertebrals: Right vertebral artery demonstrates antegrade flow. Left vertebral             artery demonstrates retrograde flow. *See table(s) above for measurements and  observations.     Preliminary      Transthoracic Echocardiogram  Normal ejection fraction 60 to 65%.  No wall motion abnormalities.  EKG -sinus rhythm borderline axis deviation abnormal R wave progression. PHYSICAL EXAM Blood pressure (!) 141/87, pulse 88, temperature 98.6 F (37 C), temperature source Oral, resp. rate  18, height  (1.753 m), weight 118.8 kg, SpO2 100 %.  GEN: Appears well-developed and well-nourished.  Obese middle-aged African-American male not in distress Psych: Affect appropriate to situation Eyes: No scleral injection HENT: No OP obstrucion Head: Normocephalic.  Cardiovascular: Normal rate and regular rhythm.  Respiratory: Effort normal, non-labored breathing GI: Soft.  No distension. There is no tenderness.  Skin: WDI  Neuro: Mental Status: Patient is awake, alert, oriented to person, place, month, year, and situation. Patient is able to give a clear and coherent history. No signs of aphasia or neglect Cranial Nerves: Partial visual field cut in left upper quadrant of left eye. Chronically blind in his right eye. Left pupil round and reactive. RAPD on the right. Right pupil is irregular, oval-shaped and nonreactive. EOMI without ptosis or diplopia. Facial sensation is symmetric to temperature. Facial movement is symmetric. Shoulder shrug is symmetric. Tongue is midline without atrophy or fasciculations.  Motor: Tone is normal. Bulk is normal. 5/5 strength was present in all four extremities.  Diminished fine motor motor movements in the left hand.  Orbits right over left upper extremity.  Minimum weakness of left grip. Sensory: Sensation is symmetric to light touch and temperature in the arms and legs. Deep Tendon Reflexes: 2+ and symmetric in the biceps with no patellar reflex Plantars: Toes are downgoing bilaterally.  Cerebellar: FNF and HKS are intact bilaterally  HOME MEDICATIONS:  Medications Prior to Admission  Medication Sig Dispense Refill   . aspirin EC 81 MG tablet Take 81 mg by mouth daily.    Marland Kitchen atorvastatin (LIPITOR) 80 MG tablet Take 80 mg by mouth at bedtime.    . chlorthalidone (HYGROTON) 25 MG tablet Take 25 mg by mouth daily.    Marland Kitchen lisinopril (ZESTRIL) 40 MG tablet Take 20 mg by mouth daily.        HOSPITAL MEDICATIONS:  . aspirin EC  81 mg Oral Daily  . atorvastatin  80 mg Oral QHS  . clopidogrel  75 mg Oral Daily  . enoxaparin (LOVENOX) injection  40 mg Subcutaneous Q24H  . feeding supplement (ENSURE ENLIVE)  237 mL Oral BID BM  . insulin aspart  0-9 Units Subcutaneous TID WC    ALLERGIES No Known Allergies  PAST MEDICAL HISTORY Past Medical History:  Diagnosis Date  . Blindness of right eye   . Detached retina   . Diabetes (HCC)   . Hypertension   . Stroke (cerebrum) [facility])     SURGICAL HISTORY History reviewed. No pertinent surgical history.  FAMILY HISTORY Family History  Problem Relation Age of Onset  . Hypertension Mother   . Hypertension Father     SOCIAL HISTORY  reports that he has never smoked. He has never used smokeless tobacco. He reports that he does not drink alcohol. No history on file for drug.  ASSESSMENT/PLAN Mr. Brock Larmon is a 58 y.o. male with history of prior right retinal detachment who presents with a 2 day history of crescentic visual field deficit in left upper quadrant of left eye as well as red lights. DDx is most likely a new right sided retinal detachment. The pattern of vision loss is not consistent with optic neuritis or CRAO. Lack of eye pain makes acute angle closure glaucoma less likely. Complete stroke work up was just done 3 weeks ago. The pt will need Ophthalmology consult. No further neuro work up this admit.  Hospital day # 1  Desiree Metzger-Cihelka, ARNP-C, ANVP-BC Pager: [phone]  I have personally obtained  history,examined this patient, reviewed notes, independently viewed imaging studies, participated in medical decision making and plan of  care.ROS completed by me personally and pertinent positives fully documented  I have made any additions or clarifications directly to the above note. Agree with note above.  The patient had a right thalamic infarct 3 weeks ago at [facility] and appears to have improved quite well from that.  He has a history of retinal detachment in the right eye and now presented with crescentic visual deficit in the left eye in the upper quadrant possibly also from retinal pathology doubt this is related to his recent stroke and MRI does not show any new strokes.  Recommend aspirin and Plavix for 3 weeks followed by aspirin alone and continue aggressive risk factor modification for stroke prevention.  Follow-up with the ophthalmologist to have retinal evaluation in the left eye after discharge.  Discussed with Dr. Caleb Popp. I have spent a total of   25 minutes with the patient reviewing hospital notes,  test results, labs and examining the patient as well as establishing an assessment and plan that was discussed personally with the patient.  > 50% of time was spent in direct patient care.      Delia Heady, MD Medical Director [facility] Stroke Center Pager: [phone] 03/15/2019 6:17 PM  To contact Stroke Continuity provider, please refer to [email or web address]. After hours, contact General Neurology

## 2019-03-15 NOTE — Progress Notes (Addendum)
[facility] PROGRESS NOTE  Kerry Gilbert ZOX:[id number] DOB: 1961/01/28 DOA: 03/14/2019 PCP: Patient, No Pcp Per  Assessment/Plan: 1. Acute left eye vision changes (crescentic deficit in left upper eye) x2 days.  On admission initial concern could represent occipital stroke. Discussed with neurology who does not think so, less likely related to prior stroke. Given only effects small quadrant of vision and no pain or progression advised outpatient follow up with opthamology though admitting physician had spoke with ophthalmologic consultant.   2. Right thalamic stroke. Occurred 3 weeks ago at [facility] after getting records faxed here.  MRA shows severely diseased posterior circulation, occluded left vertebral artery.  On aspirin and Plavix, lipitor- patient not adherent with plavix as outpatient (still waiting to receive from [place]) Results from carotid ultrasound and TTE pending.    3. AKI on CKD. [facility] records confirm discharge Cr of 1.7 ( on 4/31). Cr 2.37 on admission. Continue to hold home chlorthalidone and lisinopril, give IVF, check UA (proteinuria, casts) and repeat BMP in am if stable/improving should be ok for dc with close PCP follow up.   4. Type 2 diabetes, A1c 7.2.  Monitor CBG, sliding scale insulin as needed, diabetic diet  5. Hypertension, at goal.  Holding home BP meds given renal insufficiency, closely monitor.  Code Status: Full code Family Communication: No family at bedside (indicate person spoken with, relationship, and if by phone, the number) Disposition Plan: IVF x 24 hours, hold BP meds and UA repeat BMP in am, if stable/improves ok for dc in am, continue aspirin Plavix,   Consultants:  Ophthalmology, neurology  Procedures:  TTE pending carotid ultrasound pending  Antibiotics:  None (indicate start date, and stop date if known)  HPI/Subjective:  Kerry Gilbert is a 58 y.o. year old male with medical history significant for CVA x2 (09/2014, 02/2019),  right retinal detachment with complete blindness (2018) HTN, type 2 diabetes, HLD who presented on 03/14/2019 ED after being brought there by tech from Dr. Fatima Sanger office where he was being seen for outpatient followup of his recent stroke with 2 day history of sudden vision change in his left eye (increased redness in upper left visual field of left eye, followed by defect in vision on left eye "that looks like a hair in his eye")  Review of Faxed chart from recent [facility] hospitalization:  From 4/29-4/30, he presented for increased dizziness and unsteady gait with symptoms that to him felt exactly like his prior stroke with some right-sided weakness and reported slow talking. No mention of vision changes during that hospital stay During evaluation the ED during her hospital stay Have left facial droop, CT scan showed acute versus subacute versus chronic hypodense lesion in the right basal ganglia and internal capsule. MRI head revealed 2 cm acute infarct of the anterolateral thalamus on the right with no evidence of hemorrhage.  Of note patient had previous stroke in 2015 but was not placed on any antiplatelet agents according to the patient.  He was initiated on aspirin and Plavix instructed continue statin. Other stroke work-up  Less than 50% stenosis in the right and left internal carotid, normal antegrade flow in the right vertebral artery, left vertebral artery cannot be visualized on carotid ultrasound  MRA  absence of flow related enhancement in the left vertebral artery throughout its V4 segment.  It is unclear if the left vertebral is acutely thrombosed, or if the observed pattern of infarction relates to flow-limiting stenosis of the right vertebrobasilar junction, basilar irregularity, or  within the posterior cerebral artery on the right. Mild to moderate anterior circulation disease, without flow-limiting carotid stenosis.  50% stenosis of the left M1 MCA, 50-75% stenosis right A1  ACA  2D echo: EF 60-65%, moderate concentric LVH, diastolic filling pattern indicates impaired relaxation, interatrial and interventricular septum intact (contrast that he perform with agitated normal saline at rest), trace MR, trace TR, no evidence of pulmonary hypertension, no pericardial effusion.  Creatinine at time of discharge is 1.7. discharged on lisinopril at reduced dose of  5mg  ( from 20 mg previously) as well as aspirin, plavix.  Instructed to continue home lopressor 70 mg p.o. twice daily, Lantus 40 units daily, atorvastatin 20 mg daily, chlorthalidone 25 mg daily, amlodipine 10 mg daily, 20 units regular insulin 3 times daily.  He reports he has not started taking his plavix ( since that discharge)     Objective: Vitals:   03/15/19 0900 03/15/19 1133  BP: 123/81 (!) 141/87  Pulse: 95 88  Resp: 16 18  Temp: 98.4 F (36.9 C) 98.6 F (37 C)  SpO2: 99% 100%    Intake/Output Summary (Last 24 hours) at 03/15/2019 1316 Last data filed at 03/15/2019 0900 Gross per 24 hour  Intake 360 ml  Output 400 ml  Net -40 ml   Filed Weights   03/14/19 2132  Weight: 118.8 kg    Exam:   General: Male, lying in bed in no distress  Cardiovascular: Regular rate and rhythm, no appreciable murmurs rubs or gallops, no edema  Respiratory: Normal respiratory effort on room air  Abdomen: Soft, nondistended, nontender  Musculoskeletal: Normal range of motion  Skin no rashes or lesions  Neurologic well oriented x4, good strength in both upper and lower extremities bilaterally, reports obstruction in vision on left upper outer quadrant of the left eye that "looks like a piece of hair" in the left upper corner.  No facial droop, normal speech  Data Reviewed: Basic Metabolic Panel: Recent Labs  Lab 03/14/19 1334 03/15/19 0422  NA 137 138  K 5.1 4.4  CL 109 110  CO2 21* 19*  GLUCOSE 145* 118*  BUN 33* 29*  CREATININE 2.37* 2.07*  CALCIUM 9.7 9.3   Liver Function Tests: No  results for input(s): AST, ALT, ALKPHOS, BILITOT, PROT, ALBUMIN in the last 168 hours. No results for input(s): LIPASE, AMYLASE in the last 168 hours. No results for input(s): AMMONIA in the last 168 hours. CBC: Recent Labs  Lab 03/14/19 1334  WBC 7.6  NEUTROABS 4.4  HGB 11.7*  HCT 36.1*  MCV 84.5  PLT 276   Cardiac Enzymes: No results for input(s): CKTOTAL, CKMB, CKMBINDEX, TROPONINI in the last 168 hours. BNP (last 3 results) No results for input(s): BNP in the last 8760 hours.  ProBNP (last 3 results) No results for input(s): PROBNP in the last 8760 hours.  CBG: Recent Labs  Lab 03/14/19 1932 03/14/19 2152 03/15/19 0607 03/15/19 1147  GLUCAP 100* 140* 102* 153*    Recent Results (from the past 240 hour(s))  SARS Coronavirus 2 (CEPHEID - Performed [facility]), Hosp Order     Status: None   Collection Time: 03/14/19 12:42 PM  Result Value Ref Range Status   SARS Coronavirus 2 NEGATIVE NEGATIVE Final    Comment: (NOTE) If result is NEGATIVE SARS-CoV-2 target nucleic acids are NOT DETECTED. The SARS-CoV-2 RNA is generally detectable in upper and lower  respiratory specimens during the acute phase of infection. The lowest  concentration of  SARS-CoV-2 viral copies this assay can detect is 250  copies / mL. A negative result does not preclude SARS-CoV-2 infection  and should not be used as the sole basis for treatment or other  patient management decisions.  A negative result may occur with  improper specimen collection / handling, submission of specimen other  than nasopharyngeal swab, presence of viral mutation(s) within the  areas targeted by this assay, and inadequate number of viral copies  (<250 copies / mL). A negative result must be combined with clinical  observations, patient history, and epidemiological information. If result is POSITIVE SARS-CoV-2 target nucleic acids are DETECTED. The SARS-CoV-2 RNA is generally detectable in upper and  lower  respiratory specimens dur ing the acute phase of infection.  Positive  results are indicative of active infection with SARS-CoV-2.  Clinical  correlation with patient history and other diagnostic information is  necessary to determine patient infection status.  Positive results do  not rule out bacterial infection or co-infection with other viruses. If result is PRESUMPTIVE POSTIVE SARS-CoV-2 nucleic acids MAY BE PRESENT.   A presumptive positive result was obtained on the submitted specimen  and confirmed on repeat testing.  While 2019 novel coronavirus  (SARS-CoV-2) nucleic acids may be present in the submitted sample  additional confirmatory testing may be necessary for epidemiological  and / or clinical management purposes  to differentiate between  SARS-CoV-2 and other Sarbecovirus currently known to infect humans.  If clinically indicated additional testing with an alternate test  methodology [phone]) is advised. The SARS-CoV-2 RNA is generally  detectable in upper and lower respiratory sp ecimens during the acute  phase of infection. The expected result is Negative. Fact Sheet for Patients:  BoilerBrush.com.cy Fact Sheet for Healthcare Providers: https://pope.com/ This test is not yet approved or cleared by the [place] FDA and has been authorized for detection and/or diagnosis of SARS-CoV-2 by FDA under an Emergency Use Authorization (EUA).  This EUA will remain in effect (meaning this test can be used) for the duration of the COVID-19 declaration under Section 564(b)(1) of the Act, 21 U.S.C. section 360bbb-3(b)(1), unless the authorization is terminated or revoked sooner. Performed at [facility], [address] [place], [place] [address]      Studies: Dg Chest 2 View  Result Date: 03/15/2019 CLINICAL DATA:  Stroke 3 weeks ago EXAM: CHEST - 2 VIEW COMPARISON:  None. FINDINGS: Cardiac shadows within  normal limits. The lungs are clear bilaterally. No focal infiltrate or effusion is seen. No acute bony abnormality is noted. IMPRESSION: No active cardiopulmonary disease. Electronically Signed   By: Alcide Clever M.D.   On: 03/15/2019 00:10   Mr Brain Wo Contrast  Result Date: 03/14/2019 CLINICAL DATA:  History of retinal detachment on the right with blindness. Diabetes and hypertension. Vision worsening beginning 2 days ago. EXAM: MRI HEAD WITHOUT CONTRAST TECHNIQUE: Multiplanar, multiecho pulse sequences of the brain and surrounding structures were obtained without intravenous contrast. COMPARISON:  None. FINDINGS: Brain: Diffusion imaging does not show any acute or subacute infarction. No abnormality is seen affecting the brainstem or cerebellum. Both cerebral hemispheres show moderate chronic small-vessel ischemic changes of the white matter. There is an old small vessel infarction of the medial left thalamus. There is a lesion of the right thalamus measuring approximately 2 x 1.5 x 1 cm, with surrounding edema. This does not show susceptibility artifact. My initial assumption was that this was a subacute thalamic stroke/hemorrhage, but the absence of signal loss  on susceptibility weighted images makes thalamic hemorrhage quite unlikely. Perhaps this was a bland infarction. However, the possibility of tumor does exist and I would recommend study in this after contrast administration. Elsewhere, there is no hemorrhage, hydrocephalus or extra-axial collection. Vascular: Major vessels at the base of the brain show flow. Skull and upper cervical spine: Negative Sinuses/Orbits: Clear except for a retention cyst of the left maxillary sinus. 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 Orbit on the left appears normal. Other: None IMPRESSION: Background pattern of chronic small vessel disease of the cerebral hemispheric white matter and an old medial left thalamic infarction. Right thalamic lesion probably  representing a late subacute infarction. This measures about 2 x 1.5 x 1 cm. There does not appear to be any blood products. Whereas I favor this represents a late subacute infarction, the possibility of a tumor does exist and I would suggest that we study this after gadolinium administration, though the likelihood of tumor is remote. Electronically Signed   By: Paulina Fusi M.D.   On: 03/14/2019 17:13   Mr Maxine Glenn Head Wo Contrast  Result Date: 03/14/2019 CLINICAL DATA:  Acute presentation with visual disturbance. Abnormal MRI scan earlier. EXAM: MRA HEAD WITHOUT CONTRAST TECHNIQUE: Angiographic images of the Circle of Willis were obtained using MRA technique without intravenous contrast. COMPARISON:  MRI same day FINDINGS: Both internal carotid arteries are patent through the skull base and siphon regions without stenosis. The anterior and middle cerebral vessels appear normal and patent without proximal stenosis, aneurysm or vascular malformation. Patent posterior communicating arteries on each side. No antegrade flow is seen in the left vertebral artery. The right vertebral artery shows moderate atherosclerotic irregularity but is patent to the basilar. The basilar artery shows moderate atherosclerotic irregularity without dominant stenosis. Flow is present in both posterior cerebral arteries, but the vessels appear markedly irregular. IMPRESSION: No significant anterior circulation finding. Severely diseased posterior circulation. Occluded left vertebral artery. Atherosclerotic irregularity of the distal right vertebral artery and of the basilar artery. Pronounced atherosclerotic irregularity of the posterior circulation branch vessels, particularly the posterior cerebral arteries. Both posterior cerebral arteries do receive considerable supply from the anterior circulation Electronically Signed   By: Paulina Fusi M.D.   On: 03/14/2019 21:36   Vas US Carotid (at [facility])  Result Date:  03/15/2019 Carotid Arterial Duplex Study Indications:  CVA. Risk Factors: Hypertension, Diabetes. Performing Technologist: Blanch Media RVS  Examination Guidelines: A complete evaluation includes B-mode imaging, spectral Doppler, color Doppler, and power Doppler as needed of all accessible portions of each vessel. Bilateral testing is considered an integral part of a complete examination. Limited examinations for reoccurring indications may be performed as noted.  Right Carotid Findings: +----------+--------+--------+--------+------------+--------+           PSV cm/sEDV cm/sStenosisDescribe    Comments +----------+--------+--------+--------+------------+--------+ CCA Prox  106     19              heterogenous         +----------+--------+--------+--------+------------+--------+ CCA Distal75      19              heterogenous         +----------+--------+--------+--------+------------+--------+ ICA Prox  63      21      1-39%   heterogenous         +----------+--------+--------+--------+------------+--------+ ICA Distal82      27                                   +----------+--------+--------+--------+------------+--------+  ECA       75      11                                   +----------+--------+--------+--------+------------+--------+ +----------+--------+-------+--------+-------------------+           PSV cm/sEDV cmsDescribeArm Pressure (mmHG) +----------+--------+-------+--------+-------------------+ [id number]                                         +----------+--------+-------+--------+-------------------+ +---------+--------+--+--------+--+---------+ VertebralPSV cm/s37EDV cm/s11Antegrade +---------+--------+--+--------+--+---------+  Left Carotid Findings: +----------+--------+--------+--------+------------+--------+           PSV cm/sEDV cm/sStenosisDescribe    Comments +----------+--------+--------+--------+------------+--------+ CCA  Prox  118     18              heterogenous         +----------+--------+--------+--------+------------+--------+ CCA Distal54      20              heterogenous         +----------+--------+--------+--------+------------+--------+ ICA Prox  74      31      1-39%   heterogenous         +----------+--------+--------+--------+------------+--------+ ICA Distal57      23                                   +----------+--------+--------+--------+------------+--------+ ECA       107     17                                   +----------+--------+--------+--------+------------+--------+ +----------+--------+--------+--------+-------------------+ SubclavianPSV cm/sEDV cm/sDescribeArm Pressure (mmHG) +----------+--------+--------+--------+-------------------+           69                                          +----------+--------+--------+--------+-------------------+ +---------+--------+--+--------+-+----------+ VertebralPSV cm/s49EDV cm/s8Retrograde +---------+--------+--+--------+-+----------+  Summary: Right Carotid: Velocities in the right ICA are consistent with a 1-39% stenosis. Left Carotid: Velocities in the left ICA are consistent with a 1-39% stenosis. Vertebrals: Right vertebral artery demonstrates antegrade flow. Left vertebral             artery demonstrates retrograde flow. *See table(s) above for measurements and observations.     Preliminary     Scheduled Meds: . aspirin EC  81 mg Oral Daily  . atorvastatin  80 mg Oral QHS  . clopidogrel  75 mg Oral Daily  . enoxaparin (LOVENOX) injection  40 mg Subcutaneous Q24H  . feeding supplement (ENSURE ENLIVE)  237 mL Oral BID BM  . insulin aspart  0-9 Units Subcutaneous TID WC   Continuous Infusions:  Principal Problem:   Vision, loss, sudden, left Active Problems:   Acute ischemic stroke (HCC)   HTN (hypertension)   Blindness of right eye   DM2 (diabetes mellitus, type 2) (HCC)   Renal  insufficiency      Bonner Larue D Andru Genter  [facility]

## 2019-03-15 NOTE — Progress Notes (Signed)
  Echocardiogram 2D Echocardiogram with bubble study has been performed.  Ahtziry Saathoff L Androw 03/15/2019, 11:28 AM

## 2019-03-15 NOTE — Progress Notes (Signed)
Carotid duplex has been completed.   Preliminary results in CV Proc.   Blanch Media 03/15/2019 11:01 AM

## 2019-03-15 NOTE — Progress Notes (Signed)
Initial Nutrition Assessment   RD working remotely.  DOCUMENTATION CODES:   Obesity unspecified  INTERVENTION:  Provide Glucerna Shake po once daily, each supplement provides 220 kcal and 10 grams of protein.  NUTRITION DIAGNOSIS:   Increased nutrient needs related to acute illness as evidenced by estimated needs.  GOAL:   Patient will meet greater than or equal to 90% of their needs  MONITOR:   PO intake, Supplement acceptance, Labs, Weight trends, I & O's, Skin  REASON FOR ASSESSMENT:   Malnutrition Screening Tool    ASSESSMENT:    58 y.o. male with medical history significant of DM2, HTN.  Patient blind in R eye chronically due to detached retina. sudden vision change in his left eye, increased redness in upper left visual field of left eye, followed by defect in vision on left eye with recent stroke.  Pt unavailable during attempted contact. RD unable to obtain pt nutrition history. Meal completion has been 100%. Pt currently has Ensure ordered, however has been refusing them. RD to order Glucerna shake instead to aid in caloric and protein needs as well as in blood glucose response.    Unable to complete Nutrition-Focused physical exam at this time.   Labs and medications reviewed.   Diet Order:   Diet Order            Diet Carb Modified Fluid consistency: Thin; Room service appropriate? No  Diet effective now              EDUCATION NEEDS:   Not appropriate for education at this time  Skin:  Skin Assessment: Reviewed RN Assessment  Last BM:  Unknown  Height:   Ht Readings from Last 1 Encounters:  03/14/19 5\' 9"  (1.753 m)    Weight:   Wt Readings from Last 1 Encounters:  03/14/19 118.8 kg    Ideal Body Weight:  72.7 kg  BMI:  Body mass index is 38.68 kg/m.  Estimated Nutritional Needs:   Kcal:  2000-2200  Protein:  105-115 grams  Fluid:  2- 2.2 L/day    Roslyn Smiling, MS, RD, LDN Pager # [phone] After hours/ weekend pager #  [phone]

## 2019-03-16 DIAGNOSIS — H539 Unspecified visual disturbance: Secondary | ICD-10-CM | POA: Diagnosis not present

## 2019-03-16 DIAGNOSIS — H5440 Blindness, one eye, unspecified eye: Secondary | ICD-10-CM | POA: Diagnosis not present

## 2019-03-16 DIAGNOSIS — I639 Cerebral infarction, unspecified: Secondary | ICD-10-CM | POA: Diagnosis not present

## 2019-03-16 DIAGNOSIS — E1159 Type 2 diabetes mellitus with other circulatory complications: Secondary | ICD-10-CM | POA: Diagnosis not present

## 2019-03-16 DIAGNOSIS — I10 Essential (primary) hypertension: Secondary | ICD-10-CM | POA: Diagnosis not present

## 2019-03-16 LAB — GLUCOSE, CAPILLARY
Glucose-Capillary: 102 mg/dL — ABNORMAL HIGH (ref 70–99)
Glucose-Capillary: 156 mg/dL — ABNORMAL HIGH (ref 70–99)
Glucose-Capillary: 115 mg/dL — ABNORMAL HIGH (ref 70–99)

## 2019-03-16 LAB — BASIC METABOLIC PANEL
Anion gap: 10 (ref 5–15)
BUN: 27 mg/dL — ABNORMAL HIGH (ref 6–20)
CO2: 20 mmol/L — ABNORMAL LOW (ref 22–32)
Calcium: 9.2 mg/dL (ref 8.9–10.3)
Chloride: 107 mmol/L (ref 98–111)
Creatinine, Ser: 1.95 mg/dL — ABNORMAL HIGH (ref 0.61–1.24)
GFR calc Af Amer: 43 mL/min — ABNORMAL LOW (ref >60)
GFR calc non Af Amer: 37 mL/min — ABNORMAL LOW (ref >60)
Glucose, Bld: 111 mg/dL — ABNORMAL HIGH (ref 70–99)
Potassium: 4.4 mmol/L (ref 3.5–5.1)
Sodium: 137 mmol/L (ref 135–145)

## 2019-03-16 MED ORDER — CLOPIDOGREL BISULFATE 75 MG PO TABS: 75.0000 mg | ORAL_TABLET | Freq: Every day | ORAL | 0 refills | Status: DC

## 2019-03-16 NOTE — Progress Notes (Signed)
NURSING PROGRESS NOTE  Kerry Gilbert [id number] Discharge Data: 03/16/2019 6:05 PM Attending Provider: No att. providers found EXB:[id number], No Pcp Per     Kerry Gilbert to be D/C'd Home per MD order.  Discussed with the patient the After Visit Summary and all questions fully answered. All IV's discontinued with no bleeding noted. All belongings returned to patient for patient to take home.   Last Vital Signs:  Blood pressure 132/79, pulse 91, temperature 98.6 F (37 C), temperature source Oral, resp. rate 16, height 5\' 9"  (1.753 m), weight 118.8 kg, SpO2 100 %.  Discharge Medication List Allergies as of 03/16/2019   No Known Allergies     Medication List    STOP taking these medications   chlorthalidone 25 MG tablet Commonly known as:  HYGROTON   lisinopril 40 MG tablet Commonly known as:  ZESTRIL     TAKE these medications   aspirin EC 81 MG tablet Take 81 mg by mouth daily.   atorvastatin 80 MG tablet Commonly known as:  LIPITOR Take 80 mg by mouth at bedtime.   clopidogrel 75 MG tablet Commonly known as:  PLAVIX Take 1 tablet (75 mg total) by mouth daily. Start taking on:  Mar 17, 2019

## 2019-03-16 NOTE — Care Management (Signed)
Consult: Plavix Per [facility] average cost of Plavix is $15. Patient has [facility] Medicare so cost may be even lower.

## 2019-03-16 NOTE — Evaluation (Signed)
Speech Language Pathology Evaluation Patient Details Name: Kerry Gilbert MRN: [id number] DOB: [birth year] Today's Date: 03/16/2019 Time: [id number] SLP Time Calculation (min) (ACUTE ONLY): 26 min  Problem List:  Patient Active Problem List   Diagnosis Date Noted  . Acute ischemic stroke (HCC) 03/14/2019  . Vision, loss, sudden, left 03/14/2019  . HTN (hypertension) 03/14/2019  . Blindness of right eye 03/14/2019  . DM2 (diabetes mellitus, type 2) (HCC) 03/14/2019  . Renal insufficiency 03/14/2019   Past Medical History:  Past Medical History:  Diagnosis Date  . Blindness of right eye   . Detached retina   . Diabetes (HCC)   . Hypertension   . Stroke (cerebrum) [facility](HCC)    Past Surgical History: History reviewed. No pertinent surgical history. HPI:  58 yo male with h/o CVA 2016 admitted after sudden vision loss in left lower quandrant of his eye.  Pt is blind in his right eye.  He was found to have a right subacute cva.  Speech eval ordred.   Pt states he had right sided weakness after his prior stroke and that he uses a cane since then.   Assessment / Plan / Recommendation Clinical Impression  Non visual portions of MOCA given due to pt's visual deficits *right eye blind, field cut of left eye, left lower quandrant per neuro note* with scoring of 20/22 scoring WFL.  He does have left facial asymmetry and decreased movement concerning for facial nerve involvement however pt denies this to be a change over many years.  SLP asked him to look at pictures from prior years to compare. His speech and language is fluent and phonation/resonance is WFL>  Pt has had a UUUP for snoring - denies this caused any resonance changes or nasal regurgitation.  No SLP follow up needed. Pt educated and agreeable.     SLP Assessment  SLP Recommendation/Assessment: Patient does not need any further Speech Lanaguage Pathology Services SLP Visit Diagnosis: Cognitive communication deficit (R41.841)    Follow  Up Recommendations  None    Frequency and Duration           SLP Evaluation Cognition  Overall Cognitive Status: Within Functional Limits for tasks assessed Arousal/Alertness: Awake/alert Orientation Level: Oriented X4 Attention: Sustained Sustained Attention: Impaired(3/5 words independently, 2/5 with cues) Sustained Attention Impairment: Verbal complex Memory: Impaired Memory Impairment: Retrieval deficit(3/5 words independently, 2/5 with cues) Awareness: Appears intact Problem Solving: Appears intact Safety/Judgment: Appears intact       Comprehension  Auditory Comprehension Overall Auditory Comprehension: Appears within functional limits for tasks assessed Yes/No Questions: Not tested Commands: Within Functional Limits Conversation: Complex Visual Recognition/Discrimination Discrimination: Within Function Limits Reading Comprehension Reading Status: Not tested    Expression Expression Primary Mode of Expression: Verbal Verbal Expression Overall Verbal Expression: Appears within functional limits for tasks assessed Initiation: No impairment Repetition: No impairment Naming: Not tested Pragmatics: No impairment Written Expression Dominant Hand: Right Written Expression: Not tested   Oral / Motor  Oral Motor/Sensory Function Overall Oral Motor/Sensory Function: Mild impairment Facial ROM: Reduced left Facial Symmetry: Abnormal symmetry left Facial Strength: Within Functional Limits Facial Sensation: Within Functional Limits Lingual ROM: Within Functional Limits Lingual Symmetry: Within Functional Limits Lingual Strength: Within Functional Limits Lingual Sensation: Within Functional Limits Velum: Within Functional Limits(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 SLP Acute Rehab Services Pager [phone] Office [phone]

## 2019-03-16 NOTE — Progress Notes (Signed)
Physical Therapy Treatment Patient Details Name: Kerry Gilbert MRN: [id number] DOB: November 17, 1960 Today's Date: 03/16/2019    History of Present Illness Andren Waitkus is a 58 y.o. year old male with medical history significant for CVA x2 (09/2014, 02/2019), right retinal detachment with complete blindness (2018) HTN, type 2 diabetes, HLD who presented on 03/14/2019 ED with sudden vision change in his left eye (increased redness in upper left visual field of left eye, followed by defect in vision on left eye.MRI revealed subacute infarct in R thalamus.     PT Comments    Pt happy to be going home this evening, willing to ambulate one more time before he goes. Pt min guard for transfers and ambulation of 400 feet with SPC. Pt cued for visual scanning of his environment to avoid obstacles. D/c plans remain appropriate.   Follow Up Recommendations  Home health PT;Supervision - Intermittent     Equipment Recommendations  None recommended by PT       Precautions / Restrictions Precautions Precautions: Fall    Mobility  Bed Mobility               General bed mobility comments: pt sitting in recliner upon arrival  Transfers Overall transfer level: Needs assistance Equipment used: Straight cane Transfers: Sit to/from Stand Sit to Stand: Min guard         General transfer comment: Min guard for safety, pt utilizes cane to steady on standing  Ambulation/Gait Ambulation/Gait assistance: Min guard Gait Distance (Feet): 400 Feet Assistive device: Straight cane Gait Pattern/deviations: Step-through pattern;Decreased stride length Gait velocity: Decreased Gait velocity interpretation: 1.31 - 2.62 ft/sec, indicative of limited community ambulator General Gait Details: min guard for safety, vc for scanning environment and pt able to walk around object with this techniqu          Balance Overall balance assessment: Needs assistance Sitting-balance support: No upper extremity  supported;Feet supported Sitting balance-Leahy Scale: Good     Standing balance support: Single extremity supported;During functional activity;No upper extremity supported Standing balance-Leahy Scale: Fair Standing balance comment: able to maintain static standing with supervision                             Cognition Arousal/Alertness: Awake/alert Behavior During Therapy: WFL for tasks assessed/performed Overall Cognitive Status: Within Functional Limits for tasks assessed                                 General Comments: WFL for basic tasks, but will need f/u OT in his home.  He is able to tell me his medications and how he takes them.  He does not scan his environment well, and requires increased time for problem solving          General Comments General comments (skin integrity, edema, etc.): Pt with 3/4 DoE after ambulation SaO2 on RA 96%O2, educated on working with HHPT to improve strength and endurance      Pertinent Vitals/Pain Pain Assessment: No/denies pain           PT Goals (current goals can now be found in the care plan section) Acute Rehab PT Goals PT Goal Formulation: With patient Time For Goal Achievement: 03/29/19 Potential to Achieve Goals: Good Progress towards PT goals: Progressing toward goals    Frequency    Min 4X/week      PT Plan Current plan remains  appropriate       AM-PAC PT "6 Clicks" Mobility   Outcome Measure  Help needed turning from your back to your side while in a flat bed without using bedrails?: None Help needed moving from lying on your back to sitting on the side of a flat bed without using bedrails?: None Help needed moving to and from a bed to a chair (including a wheelchair)?: None Help needed standing up from a chair using your arms (e.g., wheelchair or bedside chair)?: None Help needed to walk in hospital room?: None Help needed climbing 3-5 steps with a railing? : A Little 6 Click Score:  23    End of Session   Activity Tolerance: Patient tolerated treatment well Patient left: in chair;with call bell/phone within reach Nurse Communication: Mobility status PT Visit Diagnosis: Unsteadiness on feet (R26.81);Muscle weakness (generalized) (M62.81)     Time: [id number] PT Time Calculation (min) (ACUTE ONLY): 21 min  Charges:  $Gait Training: 8-22 mins                     Tahji Glenpool B. Beverely RisenVan Fleet PT, DPT Acute Rehabilitation Services Pager [phone] Office [phone]    Elon Alaslizabeth B Van [facility] 03/16/2019, 4:39 PM

## 2019-03-16 NOTE — Progress Notes (Signed)
RN offered pt equipment rec'd from OT but pt declined. He verbalized that he does not need anything and just wanted to go home at this time.   Sim Boast, RN

## 2019-03-16 NOTE — Progress Notes (Signed)
Occupational Therapy Treatment Patient Details Name: Kerry DyDaniel Hing MRN: [id number] DOB: [birth year] Today's Date: 03/16/2019    History of present illness Kerry Gilbert is a 58 y.o. year old male with medical history significant for CVA x2 (09/2014, 02/2019), right retinal detachment with complete blindness (2018) HTN, type 2 diabetes, HLD who presented on 03/14/2019 ED with sudden vision change in his left eye (increased redness in upper left visual field of left eye, followed by defect in vision on left eye.MRI revealed subacute infarct in R thalamus.    OT comments  Pt appears to have reduced field of vision on Lt during confrontation testing.  He also was noted to occasionally run into doorframe on his left.  Discussed recommendation for no driving with him, and he is agreeable stating his friends can drive him.  Recommend HHOT for vision and cognitive safety issues with IADLs, as well as intermittent supervision.  Recommend follow up ophthalmology for full field assessment.   Follow Up Recommendations  Home health OT;Supervision - Intermittent    Equipment Recommendations  3 in 1 bedside commode    Recommendations for Other Services      Precautions / Restrictions Precautions Precautions: Fall       Mobility Bed Mobility               General bed mobility comments: pt sitting in recliner upon arrival  Transfers Overall transfer level: Needs assistance Equipment used: Straight cane Transfers: Sit to/from Stand Sit to Stand: Min guard         General transfer comment: Min guard for steadying assist.     Balance Overall balance assessment: Needs assistance Sitting-balance support: No upper extremity supported;Feet supported Sitting balance-Leahy Scale: Good     Standing balance support: Single extremity supported;During functional activity;No upper extremity supported Standing balance-Leahy Scale: Fair Standing balance comment: able to maintain static standing  with supervision                            ADL either performed or assessed with clinical judgement   ADL                                               Vision   Vision Assessment?: Yes Visual Fields: Left visual field deficit Additional Comments: per confrontation testing, pt with decreased field Lt side.  He requires min cues to scan environment to locate objects    Perception     Praxis      Cognition Arousal/Alertness: Awake/alert Behavior During Therapy: WFL for tasks assessed/performed Overall Cognitive Status: Within Functional Limits for tasks assessed                                 General Comments: WFL for basic tasks, but will need f/u OT in his home.  He is able to tell me his medications and how he takes them.  He does not scan his environment well, and requires increased time for problem solving         Exercises     Shoulder Instructions       General Comments Pt reports good social support.  Discussed need for follow up ophthalmology for full field test.  Recommend he does not drive, and he is agreeable to  this stating his friends can assist.      Pertinent Vitals/ Pain       Pain Assessment: No/denies pain  Home Living                                          Prior Functioning/Environment              Frequency  Min 2X/week        Progress Toward Goals  OT Goals(current goals can now be found in the care plan section)  Progress towards OT goals: Progressing toward goals     Plan Discharge plan remains appropriate    Co-evaluation                 AM-PAC OT "6 Clicks" Daily Activity     Outcome Measure   Help from another person eating meals?: None Help from another person taking care of personal grooming?: A Little Help from another person toileting, which includes using toliet, bedpan, or urinal?: A Little Help from another person bathing (including washing,  rinsing, drying)?: A Little Help from another person to put on and taking off regular upper body clothing?: A Little Help from another person to put on and taking off regular lower body clothing?: A Little 6 Click Score: 19    End of Session Equipment Utilized During Treatment: Gait belt  OT Visit Diagnosis: Other abnormalities of gait and mobility (R26.89);Low vision, both eyes (H54.2)   Activity Tolerance Patient tolerated treatment well   Patient Left in chair;with call bell/phone within reach;with chair alarm set   Nurse Communication Mobility status        Time: 6438-3818 OT Time Calculation (min): 20 min  Charges: OT General Charges $OT Visit: 1 Visit OT Treatments $Self Care/Home Management : 8-22 mins  Jeani Hawking, OTR/L Acute Rehabilitation Services Pager [phone] Office [phone]    Jeani Hawking M 03/16/2019, 10:35 AM

## 2019-03-19 DIAGNOSIS — H539 Unspecified visual disturbance: Secondary | ICD-10-CM

## 2019-03-19 NOTE — Discharge Summary (Signed)
Discharge Summary  Kerry DyDaniel Gilbert EXB:[id number] DOB: 04-27-1961  PCP: Patient, No Pcp Per  Admit date: 03/14/2019 Discharge date: 03/19/2019   Time spent: < 25 minutes  Admitted From: home Disposition:  home  Recommendations for Outpatient Follow-up:  1. [facility]  2. New medications: provided script to plavix to pick up at non[facility] pharmacy, as he [place] [facility] [place] [facility] according to the patient was supposed to get mailed to him 3. Instructed to stop taking lisinopril and chlorthalidone due to elevated Cr on admission, and have close Pcp follow up for BMP check     Discharge Diagnoses:  Active Hospital Problems   Diagnosis Date Noted   Vision, loss, sudden, left 03/14/2019   Vision changes 03/19/2019   Acute ischemic stroke (HCC) 03/14/2019   HTN (hypertension) 03/14/2019   Blindness of right eye 03/14/2019   DM2 (diabetes mellitus, type 2) (HCC) 03/14/2019   Renal insufficiency 03/14/2019    Resolved Hospital Problems  No resolved problems to display.    Discharge Condition: Stable   CODE STATUS:FULL    History of present illness:  Kerry Gilbert is a 58 y.o. year old male with medical history significant for CVA x2 (09/2014, 01/2019), right retinal detachment with complete blindness (2018) HTN, type 2 diabetes, HLD who presented on 03/14/2019 ED after being brought there by tech from Dr. Fatima Sangereveshwar's office where he was being seen for outpatient followup of his recent stroke with 2 day history of sudden vision change in his left eye (increased redness in upper left visual field of left eye, followed by defect in vision on left eye "that looks like a hair in his eye"). Remaining hospital course addressed in problem based format below:   Hospital Course:  1. Acute left eye vision changes (crescentic deficit in left upper eye) x2 days.  Small hair-like defect in his left outer quadrant visual field without pain and no  progression. Admitting doctor discussed with on call opthalmologist due to history of right eye blindness due to retinal detachment.. On admission initial concern this could represent occipital stroke, but neurology thought with pattern and lack of eye pain this was not consistent with optic neuritis, CRAO, or acute angle glacoma, or even his prior stroke. Neuro confirmed partial visual field cut on left eye exam. Recommended follow up with optho for retinal evaluation as outpatient on discharge. Patient agreed with plan.  OT and PT recommended home health therapy to assist with ALF due to chronic r sided vision loss but patient declined.   2. Subacute Right thalamic stroke. Subacute infarct here consistent [facility] per imaging review by Neurology.  MRA shows severely diseased posterior circulation, occluded left vertebral artery.  He had not been taking plavix since his discharge on 4/31 as he reports waiting for his medicatio to come in the mail, he was adherent with the aspirin prior to admission. On discharge provided script to non-[facility] pharmacy where patient agreed to pay out of pocket (c/m confirmed affordability prior to discharge) . He will continue aspirin and statin he was taking before admission.  3. AKI on CKD Stage 3.  Initially unclear patient's baseline kidney function, on admission Cr of 2.37. 24 hours later  records confirmed Cr of 1.7 on his discharge on 4/31. Likely related to nephrotoxins and slight dehydration held home chlorthalidone and lisinopril and improved from 2.3-2.07. Given poor PCP followup after  recent stroke and poor medication adherence as mentioned above with the plavix was kept additional 24 hours with addition of IVF to see if that would further improve his creatinine and repeat BMP with creatinine of 1.95.  Instructed to continue holding lisinopril and chlorthalidone on discharge until he [facility],  emphasized to follow up within the week.   4. Type 2 diabetes, A1c 7.2.  encouraged diabetic diet, on no insulin at home.   5. Hypertension. Remained at goal while holding home BP meds ( lisinopril, chlorthalidone).   Consultations:  Neurology  Procedures/Studies:  03/15/19  The left ventricle has normal systolic function with an ejection fraction of 60-65%. The cavity size was normal. Left ventricular diastolic Doppler parameters are consistent with impaired relaxation.  2. No evidence of mitral valve stenosis.  3. The interatrial septum was not assessed.  03/15/19 Carotid ultrasound Right Carotid: Velocities in the right ICA are consistent with a 1-39% stenosis.  Left Carotid: Velocities in the left ICA are consistent with a 1-39% stenosis.  Vertebrals: Right vertebral artery demonstrates antegrade flow. Left vertebral             artery demonstrates retrograde flow.  Discharge Exam: BP 132/79 (BP Location: Left Arm)    Pulse 91    Temp 98.6 F (37 C) (Oral)    Resp 16    Ht  (1.753 m)    Wt 118.8 kg    SpO2 100%    BMI 38.68 kg/m    General: Male, lying in bed in no distress  Cardiovascular: Regular rate and rhythm, no appreciable murmurs rubs or gallops, no edema  Respiratory: Normal respiratory effort on room air  Abdomen: Soft, nondistended, nontender  Musculoskeletal: Normal range of motion  Skin no rashes or lesions  Neurologic well oriented x4, good strength in both upper and lower extremities bilaterally, in left outer quadrant of left eye field small "hair-like" object, still able to count fingers on vision exam, No facial droop, normal speech   Discharge Instructions You were cared for by a hospitalist during your hospital stay. If you have any questions about your discharge medications or the care you received while you were in the hospital after you are discharged, you can call the unit and asked to speak with the hospitalist on call if the  hospitalist that took care of you is not available. Once you are discharged, your primary care physician will handle any further medical issues. Please note that NO REFILLS for any discharge medications will be authorized once you are discharged, as it is imperative that you return to your primary care physician (or establish a relationship with a primary care physician if you do not have one) for your aftercare needs so that they can reassess your need for medications and monitor your lab values.  Discharge Instructions    Diet - low sodium heart healthy   Complete by:  As directed    Increase activity slowly   Complete by:  As directed      Allergies as of 03/16/2019   No Known Allergies     Medication List    STOP taking these medications   chlorthalidone 25 MG tablet Commonly known as:  HYGROTON   lisinopril 40 MG tablet Commonly known as:  ZESTRIL     TAKE these medications   aspirin EC 81 MG tablet Take 81 mg by mouth daily.   atorvastatin 80 MG tablet Commonly known as:  LIPITOR Take  80 mg by mouth at bedtime.   clopidogrel 75 MG tablet Commonly known as:  PLAVIX Take 1 tablet (75 mg total) by mouth daily.      No Known Allergies    The results of significant diagnostics from this hospitalization (including imaging, microbiology, ancillary and laboratory) are listed below for reference.    Significant Diagnostic Studies: Dg Chest 2 View  Result Date: 03/15/2019 CLINICAL DATA:  Stroke 3 weeks ago EXAM: CHEST - 2 VIEW COMPARISON:  None. FINDINGS: Cardiac shadows within normal limits. The lungs are clear bilaterally. No focal infiltrate or effusion is seen. No acute bony abnormality is noted. IMPRESSION: No active cardiopulmonary disease. Electronically Signed   By: Alcide Clever M.D.   On: 03/15/2019 00:10   Mr Brain Wo Contrast  Result Date: 03/14/2019 CLINICAL DATA:  History of retinal detachment on the right with blindness. Diabetes and hypertension. Vision  worsening beginning 2 days ago. EXAM: MRI HEAD WITHOUT CONTRAST TECHNIQUE: Multiplanar, multiecho pulse sequences of the brain and surrounding structures were obtained without intravenous contrast. COMPARISON:  None. FINDINGS: Brain: Diffusion imaging does not show any acute or subacute infarction. No abnormality is seen affecting the brainstem or cerebellum. Both cerebral hemispheres show moderate chronic small-vessel ischemic changes of the white matter. There is an old small vessel infarction of the medial left thalamus. There is a lesion of the right thalamus measuring approximately 2 x 1.5 x 1 cm, with surrounding edema. This does not show susceptibility artifact. My initial assumption was that this was a subacute thalamic stroke/hemorrhage, but the absence of signal loss on susceptibility weighted images makes thalamic hemorrhage quite unlikely. Perhaps this was a bland infarction. However, the possibility of tumor does exist and I would recommend study in this after contrast administration. Elsewhere, there is no hemorrhage, hydrocephalus or extra-axial collection. Vascular: Major vessels at the base of the brain show flow. Skull and upper cervical spine: Negative Sinuses/Orbits: Clear except for a retention cyst of the left maxillary sinus. 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 Orbit on the left appears normal. Other: None IMPRESSION: Background pattern of chronic small vessel disease of the cerebral hemispheric white matter and an old medial left thalamic infarction. Right thalamic lesion probably representing a late subacute infarction. This measures about 2 x 1.5 x 1 cm. There does not appear to be any blood products. Whereas I favor this represents a late subacute infarction, the possibility of a tumor does exist and I would suggest that we study this after gadolinium administration, though the likelihood of tumor is remote. Electronically Signed   By: Paulina Fusi M.D.   On: 03/14/2019 17:13    Mr Maxine Glenn Head Wo Contrast  Result Date: 03/14/2019 CLINICAL DATA:  Acute presentation with visual disturbance. Abnormal MRI scan earlier. EXAM: MRA HEAD WITHOUT CONTRAST TECHNIQUE: Angiographic images of the Circle of Willis were obtained using MRA technique without intravenous contrast. COMPARISON:  MRI same day FINDINGS: Both internal carotid arteries are patent through the skull base and siphon regions without stenosis. The anterior and middle cerebral vessels appear normal and patent without proximal stenosis, aneurysm or vascular malformation. Patent posterior communicating arteries on each side. No antegrade flow is seen in the left vertebral artery. The right vertebral artery shows moderate atherosclerotic irregularity but is patent to the basilar. The basilar artery shows moderate atherosclerotic irregularity without dominant stenosis. Flow is present in both posterior cerebral arteries, but the vessels appear markedly irregular. IMPRESSION: No significant anterior circulation finding. Severely diseased  posterior circulation. Occluded left vertebral artery. Atherosclerotic irregularity of the distal right vertebral artery and of the basilar artery. Pronounced atherosclerotic irregularity of the posterior circulation branch vessels, particularly the posterior cerebral arteries. Both posterior cerebral arteries do receive considerable supply from the anterior circulation Electronically Signed   By: Paulina Fusi M.D.   On: 03/14/2019 21:36   Vas US Carotid (at [facility])  Result Date: 03/15/2019 Carotid Arterial Duplex Study Indications:  CVA. Risk Factors: Hypertension, Diabetes. Performing Technologist: Blanch Media RVS  Examination Guidelines: A complete evaluation includes B-mode imaging, spectral Doppler, color Doppler, and power Doppler as needed of all accessible portions of each vessel. Bilateral testing is considered an integral part of a complete examination. Limited examinations for  reoccurring indications may be performed as noted.  Right Carotid Findings: +----------+--------+--------+--------+------------+--------+             PSV cm/s EDV cm/s Stenosis Describe     Comments  +----------+--------+--------+--------+------------+--------+  CCA Prox   106      19                heterogenous           +----------+--------+--------+--------+------------+--------+  CCA Distal 75       19                heterogenous           +----------+--------+--------+--------+------------+--------+  ICA Prox   63       21       1-39%    heterogenous           +----------+--------+--------+--------+------------+--------+  ICA Distal 82       27                                       +----------+--------+--------+--------+------------+--------+  ECA        75       11                                       +----------+--------+--------+--------+------------+--------+ +----------+--------+-------+--------+-------------------+             PSV cm/s EDV cms Describe Arm Pressure (mmHG)  +----------+--------+-------+--------+-------------------+  Subclavian 87                                             +----------+--------+-------+--------+-------------------+ +---------+--------+--+--------+--+---------+  Vertebral PSV cm/s 37 EDV cm/s 11 Antegrade  +---------+--------+--+--------+--+---------+  Left Carotid Findings: +----------+--------+--------+--------+------------+--------+             PSV cm/s EDV cm/s Stenosis Describe     Comments  +----------+--------+--------+--------+------------+--------+  CCA Prox   118      18                heterogenous           +----------+--------+--------+--------+------------+--------+  CCA Distal 54       20                heterogenous           +----------+--------+--------+--------+------------+--------+  ICA Prox   74       31       1-39%    heterogenous           +----------+--------+--------+--------+------------+--------+  ICA Distal 57       23                                        +----------+--------+--------+--------+------------+--------+  ECA        107      17                                       +----------+--------+--------+--------+------------+--------+ +----------+--------+--------+--------+-------------------+  Subclavian PSV cm/s EDV cm/s Describe Arm Pressure (mmHG)  +----------+--------+--------+--------+-------------------+             69                                              +----------+--------+--------+--------+-------------------+ +---------+--------+--+--------+-+----------+  Vertebral PSV cm/s 49 EDV cm/s 8 Retrograde  +---------+--------+--+--------+-+----------+  Summary: Right Carotid: Velocities in the right ICA are consistent with a 1-39% stenosis. Left Carotid: Velocities in the left ICA are consistent with a 1-39% stenosis. Vertebrals: Right vertebral artery demonstrates antegrade flow. Left vertebral             artery demonstrates retrograde flow. *See table(s) above for measurements and observations.  Electronically signed by Delia Heady MD on 03/15/2019 at 1:29:12 PM.    Final     Microbiology: Recent Results (from the past 240 hour(s))  SARS Coronavirus 2 (CEPHEID - Performed in [facility] hospital lab), Hosp Order     Status: None   Collection Time: 03/14/19 12:42 PM  Result Value Ref Range Status   SARS Coronavirus 2 NEGATIVE NEGATIVE Final    Comment: (NOTE) If result is NEGATIVE SARS-CoV-2 target nucleic acids are NOT DETECTED. The SARS-CoV-2 RNA is generally detectable in upper and lower  respiratory specimens during the acute phase of infection. The lowest  concentration of SARS-CoV-2 viral copies this assay can detect is 250  copies / mL. A negative result does not preclude SARS-CoV-2 infection  and should not be used as the sole basis for treatment or other  patient management decisions.  A negative result may occur with  improper specimen collection / handling, submission of specimen other  than nasopharyngeal  swab, presence of viral mutation(s) within the  areas targeted by this assay, and inadequate number of viral copies  (<250 copies / mL). A negative result must be combined with clinical  observations, patient history, and epidemiological information. If result is POSITIVE SARS-CoV-2 target nucleic acids are DETECTED. The SARS-CoV-2 RNA is generally detectable in upper and lower  respiratory specimens dur ing the acute phase of infection.  Positive  results are indicative of active infection with SARS-CoV-2.  Clinical  correlation with patient history and other diagnostic information is  necessary to determine patient infection status.  Positive results do  not rule out bacterial infection or co-infection with other viruses. If result is PRESUMPTIVE POSTIVE SARS-CoV-2 nucleic acids MAY BE PRESENT.   A presumptive positive result was obtained on the submitted specimen  and confirmed on repeat testing.  While 2019 novel coronavirus  (SARS-CoV-2) nucleic acids may be present in the submitted sample  additional confirmatory testing may be necessary for epidemiological  and / or clinical management purposes  to differentiate between  SARS-CoV-2 and  other Sarbecovirus currently known to infect humans.  If clinically indicated additional testing with an alternate test  methodology [phone]) is advised. The SARS-CoV-2 RNA is generally  detectable in upper and lower respiratory sp ecimens during the acute  phase of infection. The expected result is Negative. Fact Sheet for Patients:  BoilerBrush.com.cy Fact Sheet for Healthcare Providers: https://pope.com/ This test is not yet approved or cleared by the [place] FDA and has been authorized for detection and/or diagnosis of SARS-CoV-2 by FDA under an Emergency Use Authorization (EUA).  This EUA will remain in effect (meaning this test can be used) for the duration of the COVID-19 declaration  under Section 564(b)(1) of the Act, 21 U.S.C. section 360bbb-3(b)(1), unless the authorization is terminated or revoked sooner. Performed at [facility], [address], [place], [place] [address]      Labs: Basic Metabolic Panel: Recent Labs  Lab 03/14/19 1334 03/15/19 0422 03/16/19 0427  NA 137 138 137  K 5.1 4.4 4.4  CL 109 110 107  CO2 21* 19* 20*  GLUCOSE 145* 118* 111*  BUN 33* 29* 27*  CREATININE 2.37* 2.07* 1.95*  CALCIUM 9.7 9.3 9.2   Liver Function Tests: No results for input(s): AST, ALT, ALKPHOS, BILITOT, PROT, ALBUMIN in the last 168 hours. No results for input(s): LIPASE, AMYLASE in the last 168 hours. No results for input(s): AMMONIA in the last 168 hours. CBC: Recent Labs  Lab 03/14/19 1334  WBC 7.6  NEUTROABS 4.4  HGB 11.7*  HCT 36.1*  MCV 84.5  PLT 276   Cardiac Enzymes: No results for input(s): CKTOTAL, CKMB, CKMBINDEX, TROPONINI in the last 168 hours. BNP: BNP (last 3 results) No results for input(s): BNP in the last 8760 hours.  ProBNP (last 3 results) No results for input(s): PROBNP in the last 8760 hours.  CBG: Recent Labs  Lab 03/15/19 1619 03/15/19 2129 03/16/19 0556 03/16/19 1117 03/16/19 1633  GLUCAP 142* 116* 102* 156* 115*       Signed:  Laverna Peace, MD [facility] 03/19/2019, 4:28 PM

## 2019-03-20 ENCOUNTER — Other Ambulatory Visit: Payer: Self-pay | Admitting: *Deleted

## 2019-03-20 NOTE — Patient Outreach (Signed)
[facility] Care Management  03/20/2019  Barrick Gleason 1961-05-19 [id number]   Subjective: Telephone call to patient's home  / mobile number, no answer, left HIPAA compliant voicemail message, and requested call back.   Objective: Per KPN (Knowledge Performance Now, point of care tool) and chart review, patient hospitalized 03/14/2019 - 03/16/2019 for stroke, vision changes left eye.   Patient also has a history of diabetes, hypertension, chronic kidney disease stage 3, CVA times 2, and right retinal detachment with blindness.       Assessment: Received [employer] / [employer] EMMI Stroke [employer] Alert follow up referral on 03/20/2019.  Red Flag Alert Triggers times 2, Day #1, patient stated no to the following questions: Scheduled a follow-up appointment?   Filled new prescriptions?   [facility] EMMI follow up pending patient contact.      Plan: RNCM will send unsuccessful outreach letter, [facility] pamphlet, handout: Know Before You Go, will call patient for 2nd telephone outreach attempt within 4 business days, [facility] EMMI follow up, and proceed with case closure, within 10 business days if no return call.      Alliyah Roesler H. Gardiner Barefoot, BSN, CCM [facility] Care Management [facility] Telephonic CM Phone: [phone] Fax: [phone]

## 2019-03-21 ENCOUNTER — Other Ambulatory Visit: Payer: Self-pay | Admitting: *Deleted

## 2019-03-21 NOTE — Patient Outreach (Addendum)
[facility]) Care Management  03/21/2019  Kerry Gilbert 02/14/1961 [id number]   Subjective: Received voicemail message from Kerry Gilbert, states he is returning call, and requested call back. Telephone call to patient's home / mobile number, spoke with patient, and HIPAA verified.  Discussed [facility] Care Management [employer] EMMI Stroke Red Flag Alert follow up, patient voiced understanding, and is in agreement to follow up.   Patient states he is doing good, remembers receiving EMMI automated calls, has filled all prescriptions, and is planning to call today to schedule hospital follow up with primary MD.  Discussed importance of hospital follow up with primary MD, patient voices understanding, and states he will follow up as appropriate.   Patient states he is able to manage self care and has assistance as needed. Patient states he does not have any education material, EMMI follow up, care coordination, care management, disease monitoring, transportation, community resource, or pharmacy needs at this time.  States he is very appreciative of the follow up and is in agreement to receive [facility] Care Management EMMI follow up in the future if needed.      Objective: Per KPN (Knowledge Performance Now, point of care tool) and chart review, patient hospitalized 03/14/2019 - 03/16/2019 for stroke, vision changes left eye.   Patient also has a history of diabetes, hypertension, chronic kidney disease stage 3, CVA times 2, and right retinal detachment with blindness.       Assessment: Received [employer] / [employer] EMMI Stroke [employer] Alert follow up referral on 03/20/2019.  Red Flag Alert Triggers times 2, Day #1, patient stated no to the following questions: Scheduled a follow-up appointment?   Filled new prescriptions?   EMMI follow up completed and no further care management needs.      Plan: RNCM will send request to Kerry Gilbert at [facility] Care Management, to  update patient's emergency contact to  [facility] [phone], patient's ex wife) and remove Kerry Gilbert, per patient's request.  RNCM will complete case closure due to follow up completed / no care management needs.       Kerry Gilbert, BSN, CCM [facility] Care Management [facility] Telephonic CM Phone: [phone] Fax: [phone]

## 2019-03-28 ENCOUNTER — Other Ambulatory Visit: Payer: Self-pay | Admitting: *Deleted

## 2019-03-28 NOTE — Patient Outreach (Addendum)
[facility]) Care Management  03/28/2019  Kerry Gilbert [birth year] [id number]   Received voicemail message from [name] times 2, states he is returning call, and requested call back.  Telephone call to patient's home  / mobile number, no answer, left HIPAA compliant voicemail message, and requested call back.  Per chart review RNCM spoke with patient on 03/21/2019, returned previous call, EMMI follow up completed, and no care management needs identifiied.  Case will remain closed. Telephone call from patient's home / mobile number, spoke with patient, and HIPAA verified.  Discussed previous conversation with this RNCM on 03/21/2019, patient states he was responding to RNCM's previous outreach attempts, did not realize that he had already spoken with this RNCM, has no additional care management needs at this time, and is appreciative of the follow up.   Case will remain closed.      [name] H. Annia Friendly, BSN, [place] Management [facility] Telephonic CM Phone: [phone] Fax: [phone]

## 2019-04-01 ENCOUNTER — Other Ambulatory Visit: Payer: Self-pay

## 2019-04-01 ENCOUNTER — Ambulatory Visit: Payer: Medicare PPO | Admitting: Podiatry

## 2019-04-01 ENCOUNTER — Other Ambulatory Visit: Payer: Self-pay | Admitting: *Deleted

## 2019-04-01 ENCOUNTER — Encounter: Payer: Self-pay | Admitting: Podiatry

## 2019-04-01 VITALS — BP 168/82 | HR 87 | Temp 96.9°F | Ht 69.0 in | Wt 260.0 lb

## 2019-04-01 DIAGNOSIS — E119 Type 2 diabetes mellitus without complications: Secondary | ICD-10-CM

## 2019-04-01 DIAGNOSIS — E1151 Type 2 diabetes mellitus with diabetic peripheral angiopathy without gangrene: Secondary | ICD-10-CM

## 2019-04-01 DIAGNOSIS — B351 Tinea unguium: Secondary | ICD-10-CM | POA: Diagnosis not present

## 2019-04-01 DIAGNOSIS — E1169 Type 2 diabetes mellitus with other specified complication: Secondary | ICD-10-CM | POA: Diagnosis not present

## 2019-04-01 NOTE — Patient Outreach (Signed)
[facility]) Care Management  04/01/2019  Avid Guillette May 09, 1961 [id number]   Subjective: Telephone call to patient's home  / mobile number, no answer, left HIPAA compliant voicemail message, and requested call back.   Objective:Per KPN (Knowledge Performance Now, point of care tool) and chart review,patient hospitalized 03/14/2019 - 03/16/2019 for stroke, vision changes left eye. Patient also has a history of diabetes, hypertension, chronic kidney disease stage 3, CVA times 2, and right retinal detachment with blindness.     Assessment: Received [employer] / [employer] EMMI Stroke Google Alert follow up referral on 04/01/19. Red Flag Alert Triggers times 2, Day #1, patient stated no to the following questions: Went to follow-up appointment? Scheduled a follow-up appointment?  [facility] EMMI follow up pending patient contact.      Plan:Patient not listed on the all payer list.  RNCM will call patient for 2nd telephone outreach attempt within 4 business days, [facility] EMMI follow up, and proceed with case closure, within 10 business days if no return call.       Anslie Spadafora H. Annia Friendly, BSN, [place] Management [facility] Telephonic CM Phone: [phone] Fax: [phone]

## 2019-04-01 NOTE — Progress Notes (Signed)
  Subjective:  Patient ID: Kerry Gilbert, male    DOB: [birth year],  MRN: [id number]  Chief Complaint  Patient presents with  . Diabetes    Diabetic nail care very long thick nails at least a year since last cut was in the hospital 2 weeks ago with symptoms of a stroke     58 y.o. male presents  for diabetic foot care. Last AMBS was 83. Denies numbness and tingling in their feet. Reports cramping in legs and thighs.  Review of Systems: Negative except as noted in the HPI. Denies N/V/F/Ch.  Past Medical History:  Diagnosis Date  . Blindness of right eye   . Detached retina   . Diabetes ([place])   . Hypertension   . Stroke (cerebrum) (HCC)     Current Outpatient Medications:  .  aspirin EC 81 MG tablet, Take 81 mg by mouth daily., Disp: , Rfl:  .  atorvastatin (LIPITOR) 80 MG tablet, Take 80 mg by mouth at bedtime., Disp: , Rfl:  .  clopidogrel (PLAVIX) 75 MG tablet, Take 1 tablet (75 mg total) by mouth daily. (Patient not taking: Reported on 04/01/2019), Disp: 21 tablet, Rfl: 0  Social History   Tobacco Use  Smoking Status Never Smoker  Smokeless Tobacco Never Used    No Known Allergies Objective:   Vitals:   04/01/19 1105  BP: (!) 168/82  Pulse: 87  Temp: (!) 96.9 F (36.1 C)   Body mass index is 38.4 kg/m. Constitutional Well developed. Well nourished.  Vascular Dorsalis pedis pulses present 1+ bilaterally  Posterior tibial pulses absent bilaterally  Pedal hair growth absent. Capillary refill normal to all digits.  No cyanosis or clubbing noted.  Neurologic Normal speech. Oriented to person, place, and time. Epicritic sensation to light touch grossly present bilaterally. Protective sensation with 5.07 monofilament  present bilaterally.  Dermatologic Nails elongated, thickened, dystrophic. Poor pedal hygiene No open wounds. No skin lesions.  Orthopedic: Normal joint ROM without pain or crepitus bilaterally. No visible deformities. No bony tenderness.    Assessment:   1. Onychomycosis of multiple toenails with type 2 diabetes mellitus and peripheral angiopathy ([place])   2. Encounter for diabetic foot exam [facility])    Plan:  Patient was evaluated and treated and all questions answered.  Diabetes with PAD, Onychomycosis -Educated on diabetic footcare. Diabetic risk level 1 -Nails x10 debrided sharply and manually with large nail nipper and rotary burr.   Procedure: Nail Debridement Rationale: Patient meets criteria for routine foot care due to PAD Type of Debridement: manual, sharp debridement. Instrumentation: Nail nipper, rotary burr. Number of Nails: 10  Return in about 3 months (around 07/02/2019) for Diabetic Foot Care.

## 2019-04-02 ENCOUNTER — Ambulatory Visit: Payer: Self-pay | Admitting: *Deleted

## 2019-04-03 ENCOUNTER — Other Ambulatory Visit: Payer: Self-pay | Admitting: *Deleted

## 2019-04-03 NOTE — Patient Outreach (Signed)
[facility]) Care Management  04/03/2019  Kerry Gilbert [birth year] [id number]   Subjective: Received voicemail message from Kerry Gilbert, states he is returning call, and requested call back. Telephone call to patient's home / mobile number, spoke with patient, and HIPAA verified.  Discussed [facility] Care Management [employer] EMMI Stroke Red Flag Alert follow up, patient voiced understanding, and is in agreement to follow up.   Patient states he is doing well, remembers speaking with this RNCM in the past, remembers receiving EMMI automated calls, had a follow up visit with podiatrist on 04/01/2019, appointment went well, and is waiting for call back from primary MD's office to schedule hospital follow up visit.  Patient decline RNCM's assistance to schedule hospital follow up and states he will follow up.  Discussed importance of hospital follow up with primary MD, patient voices understanding, and states he will follow up as appropriate.   Patient states he is aware of signs/ symptoms to report, how to reach provider if needed after hours, when to go to ED, and / or call 911.   Patient states he does not have any education material, EMMI follow up, care coordination, care management, disease monitoring, transportation, community resource, or pharmacy needs at this time.  States he is very appreciative of the follow up, is in agreement to receive [place] Management EMMI follow up calls in the future if needed, and will reach out to [place] Management if assistance needed.     Objective:Per KPN (Knowledge Performance Now, point of care tool) and chart review,patient hospitalized 03/14/2019 - 03/16/2019 for stroke, vision changes left eye. Patient also has a history of diabetes, hypertension, chronic kidney disease stage 3, CVA times 2, and right retinal detachment with blindness.     Assessment: Received [employer] / [employer] Medicare EMMI Stroke [employer] Alert follow up referral on 04/01/19. Red Flag Alert Triggers times 2, Day #1, patient stated no to the following questions: Went to follow-up appointment? Scheduled a follow-up appointment?  EMMI follow up completed and no further care management needs.      Plan:RNCM will complete case closure due to follow up completed / no care management needs.        Kerry Gilbert, BSN, [place] Management [facility] Telephonic CM Phone: [phone] Fax: [phone]

## 2019-04-11 ENCOUNTER — Telehealth (HOSPITAL_COMMUNITY): Payer: Self-pay

## 2019-04-11 NOTE — Telephone Encounter (Signed)
Pt will call back to schedule consult after he talks to his driver. AW

## 2019-04-15 ENCOUNTER — Telehealth (HOSPITAL_COMMUNITY): Payer: Self-pay

## 2019-04-15 NOTE — Telephone Encounter (Signed)
Returned pt's call, no answer, left vm. AW  

## 2019-04-22 ENCOUNTER — Ambulatory Visit (HOSPITAL_COMMUNITY)
Admission: RE | Admit: 2019-04-22 | Discharge: 2019-04-22 | Disposition: A | Discharge status: Home / Self Care | Payer: No Typology Code available for payment source | Source: Ambulatory Visit | Attending: Interventional Radiology | Admitting: Interventional Radiology

## 2019-04-22 ENCOUNTER — Other Ambulatory Visit: Payer: Self-pay

## 2019-04-22 DIAGNOSIS — I63519 Cerebral infarction due to unspecified occlusion or stenosis of unspecified middle cerebral artery: Secondary | ICD-10-CM

## 2019-04-22 NOTE — Consult Note (Signed)
Chief Complaint: Patient was seen in consultation today for basilar artery stenosis.  Referring Physician(s): None  Supervising Physician: Julieanne Cottoneveshwar, Sanjeev  [facility]  History of Present Illness: Kerry Gilbert is a 58 y.o. male with a past medical history as below, with pertinent past medical history including hypertension, CVA 2015 and 01/2019, diabetes mellitus, and blindess of right eye. He was recently admitted to [facility] from 02/12/2019 to 02/14/2019 for management of an acute CVA (right thalamic infarct). During workup, MRA head 02/13/2019 revealed basilar artery and bilateral MCA stenosis. Neurology was consulted who recommended medical management [facility] of [facility] stenosis. Patient was discharged home 02/14/2019 on DAPT (Plavix 75 mg and Aspirin 81 mg).  Patient presents today to discuss management of basilar artery stenosis. Patient awake and alert sitting in wheelchair. Complains of gait disturbances/"feeling off balance", stable since CVA 01/2019. Denies weakness, numbness/tingling, dizziness, vision changes, hearing changes, tinnitus, or speech difficulty.  Patient is currently taking Aspirin 81 mg once daily.   Past Medical History:  Diagnosis Date  . Blindness of right eye   . Detached retina   . Diabetes (HCC)   . Hypertension   . Stroke (cerebrum) (HCC)     No past surgical history on file.  Allergies: Patient has no known allergies.  Medications: Prior to Admission medications   Medication Sig Start Date End Date Taking? Authorizing Provider  aspirin EC 81 MG tablet Take 81 mg by mouth daily.    [provider]  atorvastatin (LIPITOR) 80 MG tablet Take 80 mg by mouth at bedtime.    [provider]  clopidogrel (PLAVIX) 75 MG tablet Take 1 tablet (75 mg total) by mouth daily. Patient not taking: Reported on 04/01/2019 03/17/19   Laverna PeaceNettey, Shayla D, MD     Family  History  Problem Relation Age of Onset  . Hypertension Mother   . Hypertension Father     Social History   Socioeconomic History  . Marital status: Married    Spouse name: Not on file  . Number of children: Not on file  . Years of education: Not on file  . Highest education level: Not on file  Occupational History  . Not on file  Social Needs  . Financial resource strain: Not on file  . Food insecurity    Worry: Not on file    Inability: Not on file  . Transportation needs    Medical: Not on file    Non-medical: Not on file  Tobacco Use  . Smoking status: Never Smoker  . Smokeless tobacco: Never Used  Substance and Sexual Activity  . Alcohol use: Never    Frequency: Never  . Drug use: Not on file  . Sexual activity: Not on file  Lifestyle  . Physical activity    Days per week: Not on file    Minutes per session: Not on file  . Stress: Not on file  Relationships  . Social Musicianconnections    Talks on phone: Not on file    Gets together: Not on file    Attends religious service: Not on file    Active member of club or organization: Not on file    Attends meetings of clubs or organizations: Not on file    Relationship status: Not on file  Other Topics Concern  . Not on file  Social History Narrative  . Not on file     Review of Systems: A 12  point ROS discussed and pertinent positives are indicated in the HPI above.  All other systems are negative.  Review of Systems  Constitutional: Negative for chills and fever.  HENT: Negative for hearing loss and tinnitus.   Eyes: Negative for visual disturbance.  Respiratory: Negative for shortness of breath and wheezing.   Cardiovascular: Negative for chest pain and palpitations.  Musculoskeletal: Positive for gait problem.  Neurological: Negative for dizziness, speech difficulty, weakness, numbness and headaches.  Psychiatric/Behavioral: Negative for behavioral problems and confusion.    Physical Exam Constitutional:       General: He is not in acute distress.    Appearance: Normal appearance.  Pulmonary:     Effort: Pulmonary effort is normal. No respiratory distress.  Skin:    General: Skin is warm and dry.  Neurological:     Mental Status: He is alert and oriented to person, place, and time.  Psychiatric:        Mood and Affect: Mood normal.        Behavior: Behavior normal.        Thought Content: Thought content normal.        Judgment: Judgment normal.      Labs:  CBC: Recent Labs    03/14/19 1334  WBC 7.6  HGB 11.7*  HCT 36.1*  PLT 276    COAGS: Recent Labs    03/14/19 1334  INR 1.0    BMP: Recent Labs    03/14/19 1334 03/15/19 0422 03/16/19 0427  NA 137 138 137  K 5.1 4.4 4.4  CL 109 110 107  CO2 21* 19* 20*  GLUCOSE 145* 118* 111*  BUN 33* 29* 27*  CALCIUM 9.7 9.3 9.2  CREATININE 2.37* 2.07* 1.95*  GFRNONAA 29* 35* 37*  GFRAA 34* 40* 43*     Assessment and Plan:  Basilar artery stenosis. Dr. Estanislado Pandy was present for consultation. Discussed patient's symptoms since CVA 01/2019. Patient states that his only residual symptom is instability with gait. States that he uses a cane for ambulation. Discussed results of MRA from [facility], including basilar artery stenosis. Explained that this is the probable cause of his CVA 01/2019. Recommended patient undergo an image-guided diagnostic cerebral arteriogram to evaluate and determine treatment options for basilar artery stenosis. Patient expresses desire to move forward with procedure.  Discussed patients diabetes mellitus. Patient states that his glucose reading was 131 this AM and states it normally runs in the 100s-115s. Advised patient to take diabetic medications as prescribed and to follow-up with PCP regularly regarding diabetes mellitus control.  Discussed patients hypertension. Advised patient to take hypertensive medications as prescribed and to follow-up with PCP regularly regarding hypertension  control.  Plan for follow-up with an image-guided diagnostic cerebral arteriogram ASAP. Informed patient that our schedulers will call him to set up this procedure. Instructed patient to continue taking Aspirin 81 mg once daily. Instructed patient to begin taking Plavix 75 mg once daily.  All questions answered and concerns addressed. Patient conveys understanding and agrees with plan.  Thank you for this interesting consult.  I greatly enjoyed meeting Christia Coaxum and look forward to participating in their care.  A copy of this report was sent to the requesting provider on this date.  Electronically Signed: Earley Abide, PA-C 04/22/2019, 8:54 AM   I spent a total of 40 Minutes in face to face in clinical consultation, greater than 50% of which was counseling/coordinating care for basilar artery stenosis.

## 2019-07-01 ENCOUNTER — Ambulatory Visit: Payer: No Typology Code available for payment source | Admitting: Podiatry

## 2019-09-02 ENCOUNTER — Ambulatory Visit (INDEPENDENT_AMBULATORY_CARE_PROVIDER_SITE_OTHER): Payer: Medicare Other | Admitting: Podiatry

## 2019-09-02 ENCOUNTER — Other Ambulatory Visit: Payer: Self-pay

## 2019-09-02 DIAGNOSIS — B351 Tinea unguium: Secondary | ICD-10-CM | POA: Diagnosis not present

## 2019-09-02 DIAGNOSIS — E1169 Type 2 diabetes mellitus with other specified complication: Secondary | ICD-10-CM

## 2019-09-02 DIAGNOSIS — E1151 Type 2 diabetes mellitus with diabetic peripheral angiopathy without gangrene: Secondary | ICD-10-CM | POA: Diagnosis not present

## 2019-09-02 NOTE — Progress Notes (Signed)
  Subjective:  Patient ID: Kerry Gilbert, male    DOB: 02-02-1961,  MRN: [id number]  Chief Complaint  Patient presents with  . debride    diabetic nail trimming  . Diabetes    FBS: 120 A1C: >5 PCP: [facility] x 1 yr    58 y.o. male presents  for diabetic foot care. Last AMBS was 120. Denies numbness and tingling in their feet. Reports cramping in legs and thighs.  Review of Systems: Negative except as noted in the HPI. Denies N/V/F/Ch.  Past Medical History:  Diagnosis Date  . Blindness of right eye   . Detached retina   . Diabetes ([place])   . Hypertension   . Stroke (cerebrum) (HCC)     Current Outpatient Medications:  .  aspirin EC 81 MG tablet, Take 81 mg by mouth daily., Disp: , Rfl:  .  atorvastatin (LIPITOR) 80 MG tablet, Take 80 mg by mouth at bedtime., Disp: , Rfl:   Social History   Tobacco Use  Smoking Status Never Smoker  Smokeless Tobacco Never Used    No Known Allergies Objective:   There were no vitals filed for this visit. There is no height or weight on file to calculate BMI. Constitutional Well developed. Well nourished.  Vascular Dorsalis pedis pulses present 1+ bilaterally  Posterior tibial pulses absent bilaterally  Pedal hair growth absent. Capillary refill normal to all digits.  No cyanosis or clubbing noted.  Neurologic Normal speech. Oriented to person, place, and time. Epicritic sensation to light touch grossly present bilaterally. Protective sensation with 5.07 monofilament  present bilaterally.  Dermatologic Nails elongated, thickened, dystrophic. Poor pedal hygiene No open wounds. No skin lesions.  Orthopedic: Normal joint ROM without pain or crepitus bilaterally. No visible deformities. No bony tenderness.   Assessment:   1. Onychomycosis of multiple toenails with type 2 diabetes mellitus and peripheral angiopathy ([place])    Plan:  Patient was evaluated and treated and all questions answered.  Diabetes with PAD, Onychomycosis  -Educated on diabetic footcare. Diabetic risk level 1 -Nails x10 debrided sharply and manually with large nail nipper and rotary burr.   Procedure: Nail Debridement Rationale: Patient meets criteria for routine foot care due to pad Type of Debridement: manual, sharp debridement. Instrumentation: Nail nipper, rotary burr. Number of Nails: 10     Return in about 3 months (around 12/03/2019) for Diabetic Foot Care.

## 2019-12-03 ENCOUNTER — Ambulatory Visit: Payer: Medicare Other | Admitting: Podiatry

## 2019-12-10 ENCOUNTER — Ambulatory Visit: Payer: Medicare PPO | Admitting: Podiatry

## 2019-12-10 ENCOUNTER — Other Ambulatory Visit: Payer: Self-pay

## 2019-12-10 DIAGNOSIS — E1151 Type 2 diabetes mellitus with diabetic peripheral angiopathy without gangrene: Secondary | ICD-10-CM | POA: Diagnosis not present

## 2019-12-10 DIAGNOSIS — B351 Tinea unguium: Secondary | ICD-10-CM | POA: Diagnosis not present

## 2019-12-10 DIAGNOSIS — E1169 Type 2 diabetes mellitus with other specified complication: Secondary | ICD-10-CM | POA: Diagnosis not present

## 2019-12-10 NOTE — Progress Notes (Signed)
  Subjective:  Patient ID: Kerry Gilbert, male    DOB: 12-Mar-1961,  MRN: [id number]  Chief Complaint  Patient presents with  . debride    diabetic nail trimming  . Diabetes    FBS: 121 A1C: 5.2 PCP: [facility] x 1 wk     59 y.o. male presents with the above complaint. History confirmed with patient.   Objective:  Physical Exam: warm, good capillary refill, nail exam onychomycosis of the toenails, no trophic changes or ulcerative lesions. DP pulses palpable and protective sensation intact, non-palpable PT pulses  Left Foot: normal exam, no swelling, tenderness, instability; ligaments intact, full range of motion of all ankle/foot joints  Right Foot: normal exam, no swelling, tenderness, instability; ligaments intact, full range of motion of all ankle/foot joints   No images are attached to the encounter.  Assessment:   1. Onychomycosis of multiple toenails with type 2 diabetes mellitus and peripheral angiopathy (HCC)      Plan:  Patient was evaluated and treated and all questions answered.  Onychomycosis, Diabetes and PAD -Patient is diabetic with a qualifying condition for at risk foot care.  Procedure: Nail Debridement Rationale: Patient meets criteria for routine foot care due to PAD Type of Debridement: manual, sharp debridement. Instrumentation: Nail nipper, rotary burr. Number of Nails: 10  No follow-ups on file.

## 2020-03-10 ENCOUNTER — Ambulatory Visit: Payer: Medicare PPO | Admitting: Podiatry

## 2020-03-12 ENCOUNTER — Ambulatory Visit (INDEPENDENT_AMBULATORY_CARE_PROVIDER_SITE_OTHER): Payer: Medicare Other | Admitting: Podiatry

## 2020-03-12 ENCOUNTER — Encounter: Payer: Self-pay | Admitting: Podiatry

## 2020-03-12 ENCOUNTER — Other Ambulatory Visit: Payer: Self-pay

## 2020-03-12 DIAGNOSIS — E1159 Type 2 diabetes mellitus with other circulatory complications: Secondary | ICD-10-CM

## 2020-03-12 DIAGNOSIS — B351 Tinea unguium: Secondary | ICD-10-CM

## 2020-03-12 DIAGNOSIS — M79674 Pain in right toe(s): Secondary | ICD-10-CM | POA: Diagnosis not present

## 2020-03-12 DIAGNOSIS — M79675 Pain in left toe(s): Secondary | ICD-10-CM | POA: Diagnosis not present
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
## 2020-07-02 ENCOUNTER — Ambulatory Visit (INDEPENDENT_AMBULATORY_CARE_PROVIDER_SITE_OTHER): Payer: Medicare Other | Admitting: Podiatry

## 2020-07-02 ENCOUNTER — Other Ambulatory Visit: Payer: Self-pay

## 2020-07-02 ENCOUNTER — Encounter: Payer: Self-pay | Admitting: Podiatry

## 2020-07-02 DIAGNOSIS — M79675 Pain in left toe(s): Secondary | ICD-10-CM

## 2020-07-02 DIAGNOSIS — M79674 Pain in right toe(s): Secondary | ICD-10-CM | POA: Diagnosis not present

## 2020-07-02 DIAGNOSIS — E1159 Type 2 diabetes mellitus with other circulatory complications: Secondary | ICD-10-CM | POA: Diagnosis not present

## 2020-07-02 DIAGNOSIS — B351 Tinea unguium: Secondary | ICD-10-CM

## 2020-07-02 NOTE — Progress Notes (Signed)
Subjective: Kerry Gilbert is a 59 y.o. male patient seen today preventative diabetic foot care and painful mycotic nails b/l that are difficult to trim. Pain interferes with ambulation. Aggravating factors include wearing enclosed shoe gear. Pain is relieved with periodic professional debridement.  He voices no new pedal concerns on today's visit. He has moved into his new home and is very pleased with it.  He is followed medically by the [facility] in [place]. States his next visit is 07/21/2020.   Patient Active Problem List   Diagnosis Date Noted  . Vision changes 03/19/2019  . Acute ischemic stroke (HCC) 03/14/2019  . Vision, loss, sudden, left 03/14/2019  . HTN (hypertension) 03/14/2019  . Blindness of right eye 03/14/2019  . DM2 (diabetes mellitus, type 2) (HCC) 03/14/2019  . Renal insufficiency 03/14/2019    Current Outpatient Medications on File Prior to Visit  Medication Sig Dispense Refill  . aspirin EC 81 MG tablet Take 81 mg by mouth daily.    Marland Kitchen atorvastatin (LIPITOR) 80 MG tablet Take 80 mg by mouth at bedtime.    Marland Kitchen lisinopril (ZESTRIL) 10 MG tablet Take 10 mg by mouth daily.    . prednisoLONE acetate (PRED FORTE) 1 % ophthalmic suspension      No current facility-administered medications on file prior to visit.    No Known Allergies  Objective: Physical Exam  General: 59 y.o. African American male, morbidly obese, in no acute distress. Awake, alert and oriented x 3.  Neurovascular status unchanged b/l.  Neurovascular status unchanged b/l lower extremities. Capillary fill time to digits <3 seconds b/l lower extremities. Palpable DP pulses b/l. Nonpalpable PT pulse(s) b/l lower extremities. Pedal hair sparse b/l. Skin temperature gradient within normal limits b/l.  Protective sensation intact 5/5 intact bilaterally with 10g monofilament b/l. Vibratory sensation intact b/l. Proprioception intact bilaterally.  Dermatological:  Pedal skin with normal turgor,  texture and tone bilaterally. No open wounds bilaterally. No interdigital macerations bilaterally. Toenails 1-5 b/l elongated, discolored, dystrophic, thickened, crumbly with subungual debris and tenderness to dorsal palpation.  Musculoskeletal:  Normal muscle strength 5/5 to all lower extremity muscle groups bilaterally. No pain crepitus or joint limitation noted with ROM b/l. No gross bony deformities bilaterally. Wearing appropriate fitting shoe gear. Utilizes cane for ambulation assistance.  Assessment and Plan:  1. Pain due to onychomycosis of toenails of both feet   2. Type 2 diabetes mellitus with other circulatory complication, without long-term current use of insulin (HCC)    -Examined patient. -No new findings. No new orders. -Toenails 1-5 b/l were debrided in length and girth with sterile nail nippers and dremel without iatrogenic bleeding.  -Patient to continue soft, supportive shoe gear daily. -Patient to report any pedal injuries to medical professional immediately. -Patient/POA to call should there be question/concern in the interim.  Return in about 3 months (around 10/01/2020).  [name], DPM

## 2020-09-07 IMAGING — DX CHEST - 2 VIEW
3 series · 3 of 3 positions shown · non-contrast
Comparison: None.

CLINICAL DATA: Stroke 3 weeks ago

EXAM:
CHEST - 2 VIEW

[x chest ap]
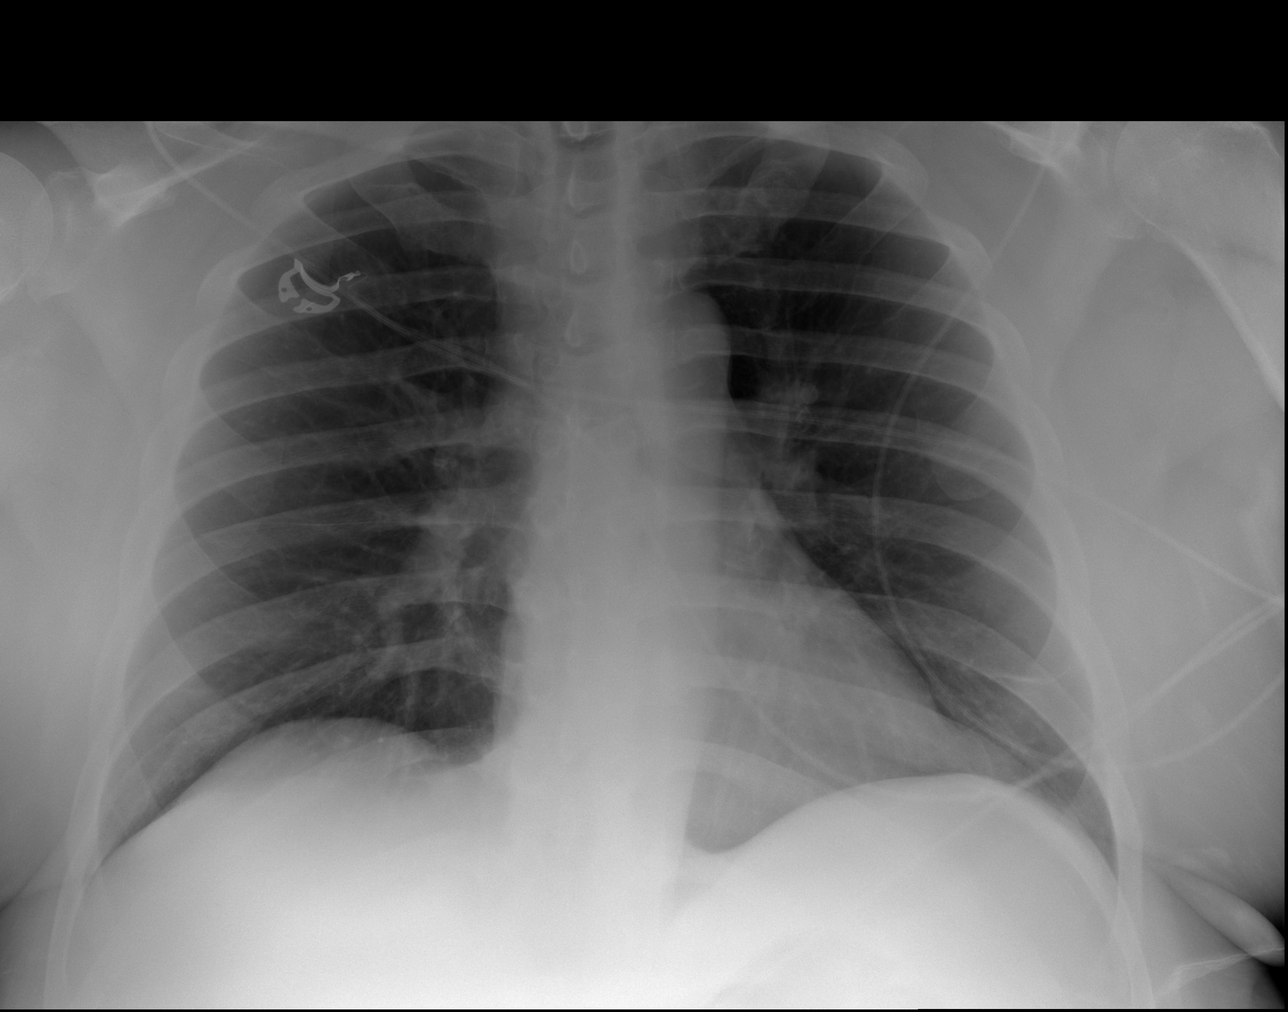

[w chest lat (1 of 2)]
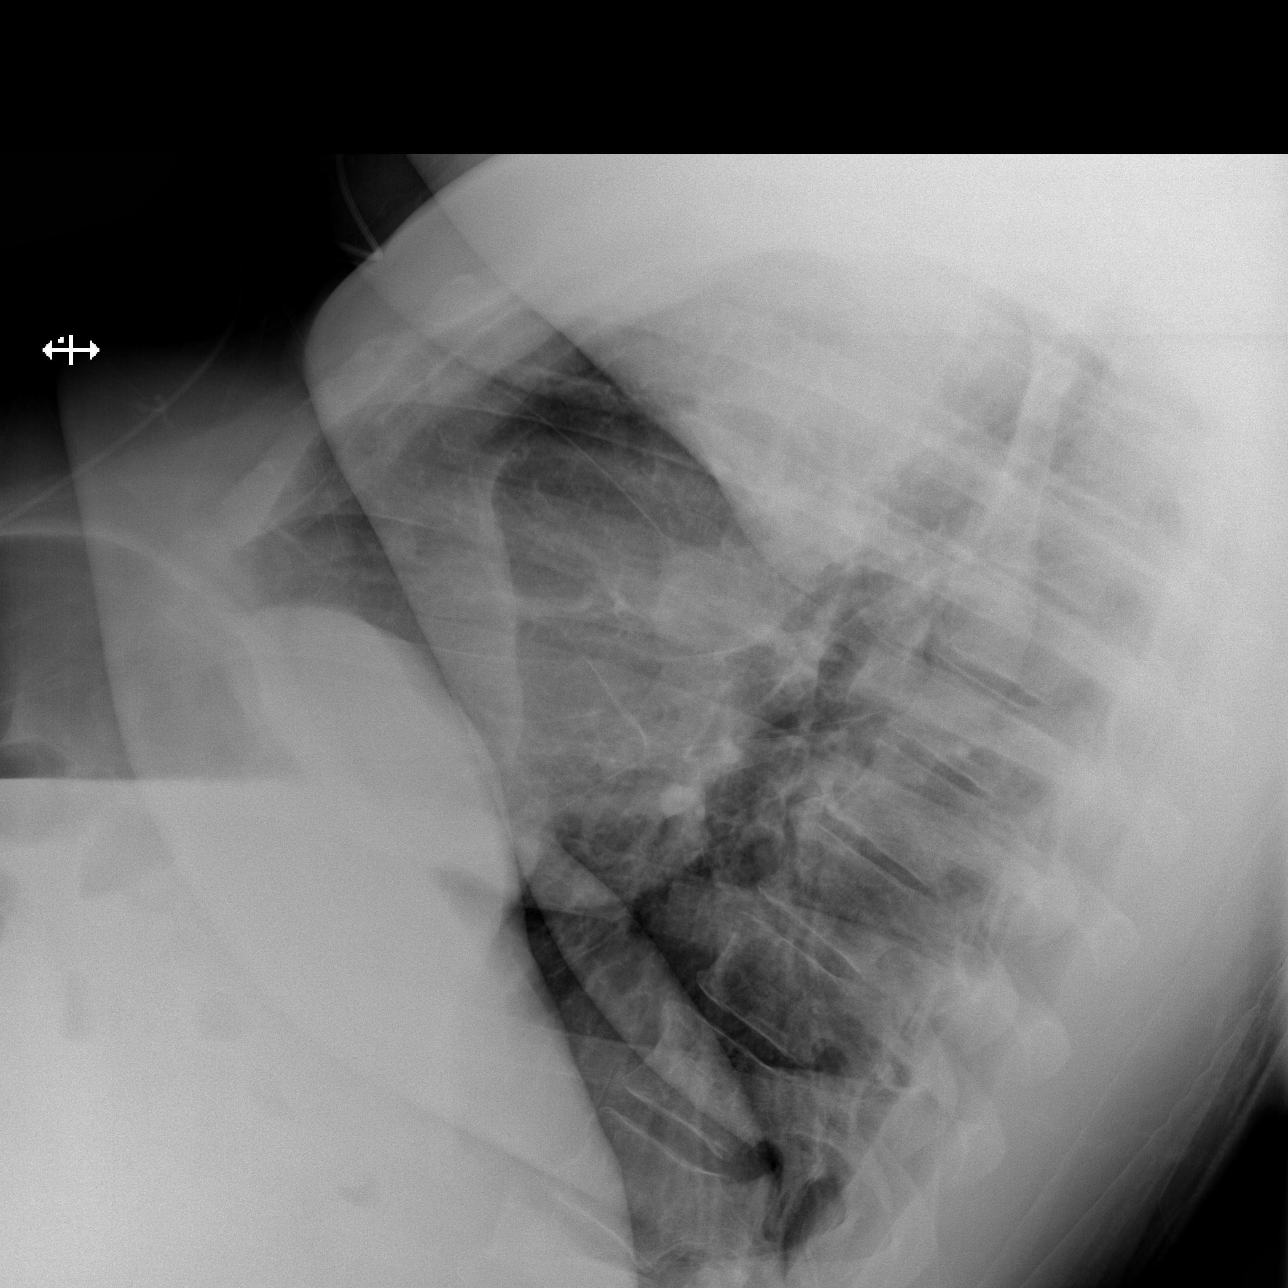

[w chest lat (2 of 2)]
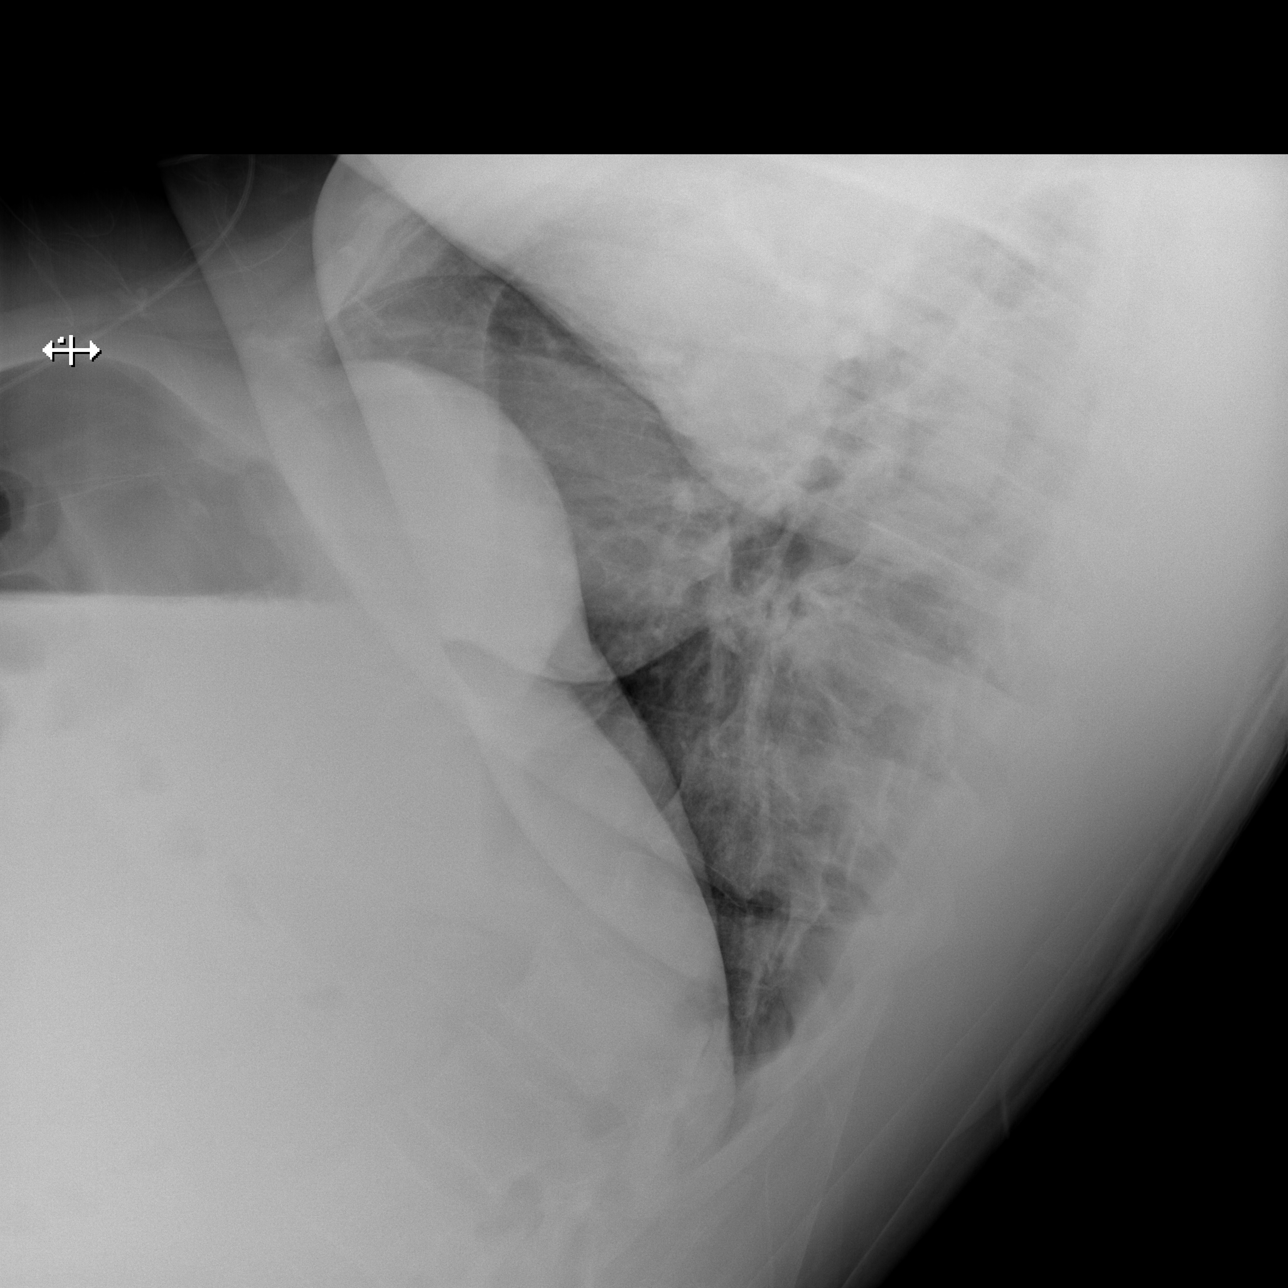

[3 of 3 positions shown; findings below may reference images not displayed]

FINDINGS: Cardiac shadows within normal limits. The lungs are clear
bilaterally. No focal infiltrate or effusion is seen. No acute bony
abnormality is noted.
IMPRESSION: No active cardiopulmonary disease.

## 2020-10-01 ENCOUNTER — Other Ambulatory Visit: Payer: Self-pay

## 2020-10-01 ENCOUNTER — Ambulatory Visit: Payer: Medicare Other | Admitting: Podiatry

## 2020-10-01 ENCOUNTER — Encounter: Payer: Self-pay | Admitting: Podiatry

## 2020-10-01 DIAGNOSIS — M79674 Pain in right toe(s): Secondary | ICD-10-CM | POA: Diagnosis not present

## 2020-10-01 DIAGNOSIS — M79675 Pain in left toe(s): Secondary | ICD-10-CM | POA: Diagnosis not present

## 2020-10-01 DIAGNOSIS — B351 Tinea unguium: Secondary | ICD-10-CM

## 2020-10-01 DIAGNOSIS — E1159 Type 2 diabetes mellitus with other circulatory complications: Secondary | ICD-10-CM

## 2020-10-01 NOTE — Patient Instructions (Signed)
For extremely dry, cracked feet: moisturize feet once daily; do not apply between toes:  A.  Aquaphor Advanced Therapy Ointment Body Spray   or  B.  Vaseline Intensive Care Spray Lotion Advanced Repair

## 2020-10-04 NOTE — Progress Notes (Signed)
Subjective: Kerry Gilbert is a 59 y.o. male patient seen today preventative diabetic foot care and painful mycotic nails b/l that are difficult to trim. Pain interferes with ambulation. Aggravating factors include wearing enclosed shoe gear. Pain is relieved with periodic professional debridement.  He voices no new pedal concerns on today's visit. He states his blood sugar was 87 mg/dl this morning.   He is followed medically by the [facility] in [place]. Last visit was in October.  He would like to know what moisturizer he can apply to his feet.   Patient Active Problem List   Diagnosis Date Noted  . Vision changes 03/19/2019  . Acute ischemic stroke (HCC) 03/14/2019  . Vision, loss, sudden, left 03/14/2019  . HTN (hypertension) 03/14/2019  . Blindness of right eye 03/14/2019  . DM2 (diabetes mellitus, type 2) (HCC) 03/14/2019  . Renal insufficiency 03/14/2019    Current Outpatient Medications on File Prior to Visit  Medication Sig Dispense Refill  . amlodipine-atorvastatin (CADUET) 10-20 MG tablet Take 1 tablet by mouth daily.    Marland Kitchen aspirin EC 81 MG tablet Take 81 mg by mouth daily.    Marland Kitchen atorvastatin (LIPITOR) 80 MG tablet Take 80 mg by mouth at bedtime.    Marland Kitchen lisinopril (ZESTRIL) 10 MG tablet Take 10 mg by mouth daily.     No current facility-administered medications on file prior to visit.    No Known Allergies  Objective: Physical Exam  General: 59 y.o. African American male, morbidly obese, in no acute distress. Awake, alert and oriented x 3.  Neurovascular status unchanged b/l.  Neurovascular status unchanged b/l lower extremities. Capillary fill time to digits <3 seconds b/l lower extremities. Palpable DP pulses b/l. Nonpalpable PT pulse(s) b/l lower extremities. Pedal hair sparse b/l. Skin temperature gradient within normal limits b/l.  Protective sensation intact 5/5 intact bilaterally with 10g monofilament b/l. Vibratory sensation intact b/l. Proprioception  intact bilaterally.  Dermatological:  Pedal skin with normal turgor, texture and tone bilaterally. No open wounds bilaterally. No interdigital macerations bilaterally. Toenails 1-5 b/l elongated, discolored, dystrophic, thickened, crumbly with subungual debris and tenderness to dorsal palpation. Pedal skin noted to be dry and flaky b/l lower extremities.  Musculoskeletal:  Normal muscle strength 5/5 to all lower extremity muscle groups bilaterally. No pain crepitus or joint limitation noted with ROM b/l. No gross bony deformities bilaterally. Wearing appropriate fitting shoe gear. Utilizes cane for ambulation assistance.  Assessment and Plan:  1. Pain due to onychomycosis of toenails of both feet   2. Type 2 diabetes mellitus with other circulatory complication, without long-term current use of insulin (HCC)    -Examined patient. -No new findings. No new orders. -Toenails 1-5 b/l were debrided in length and girth with sterile nail nippers and dremel without iatrogenic bleeding.  -Patient to report any pedal injuries to medical professional immediately. -For dry skin, patient was given written list of OTC moisturizers. Patient/POA instructed to apply to foot/feet once daily avoiding application between toes.   Return in about 3 months (around 12/30/2020) for diabetic foot care.  [name], DPM

## 2020-12-31 ENCOUNTER — Ambulatory Visit: Payer: Medicare PPO | Admitting: Podiatry

## 2020-12-31 ENCOUNTER — Encounter: Payer: Self-pay | Admitting: Podiatry

## 2020-12-31 ENCOUNTER — Other Ambulatory Visit: Payer: Self-pay

## 2020-12-31 DIAGNOSIS — B351 Tinea unguium: Secondary | ICD-10-CM | POA: Diagnosis not present

## 2020-12-31 DIAGNOSIS — M79674 Pain in right toe(s): Secondary | ICD-10-CM

## 2020-12-31 DIAGNOSIS — M79675 Pain in left toe(s): Secondary | ICD-10-CM

## 2020-12-31 DIAGNOSIS — E1159 Type 2 diabetes mellitus with other circulatory complications: Secondary | ICD-10-CM | POA: Diagnosis not present

## 2020-12-31 NOTE — Progress Notes (Signed)
Subjective: Kerry Gilbert is a 60 y.o. male patient seen today preventative diabetic foot care and painful mycotic nails b/l that are difficult to trim. Pain interferes with ambulation. Aggravating factors include wearing enclosed shoe gear. Pain is relieved with periodic professional debridement.  He voices no new pedal concerns on today's visit. He states his blood sugar was 116 mg/dl this morning.   He is followed medically by the [facility] in [place]. Last visit was in December 30, 2020.  He states he has been applying Vaseline to his feet and they still seem dry.  No Known Allergies  Objective: Physical Exam  General: 60 y.o. African American male, morbidly obese, in no acute distress. Awake, alert and oriented x 3.  Neurovascular: Capillary fill time to digits <3 seconds b/l lower extremities. Palpable pedal pulses b/l LE. Nonpalpable PT pulse(s) b/l lower extremities. Pedal hair sparse. Lower extremity skin temperature gradient within normal limits.  Protective sensation intact 5/5 intact bilaterally with 10g monofilament b/l. Vibratory sensation intact b/l. Proprioception intact bilaterally.  Dermatological:  Pedal skin with normal turgor, texture and tone bilaterally. No open wounds bilaterally. No interdigital macerations bilaterally. Toenails 1-5 b/l elongated, discolored, dystrophic, thickened, crumbly with subungual debris and tenderness to dorsal palpation. Improvement in pedal integument with use of Vaseline Petroleum Jelly.  Musculoskeletal:  Normal muscle strength 5/5 to all lower extremity muscle groups bilaterally. No pain crepitus or joint limitation noted with ROM b/l. No gross bony deformities bilaterally. Wearing appropriate fitting shoe gear. Utilizes cane for ambulation assistance.  Assessment and Plan:  1. Pain due to onychomycosis of toenails of both feet   2. Type 2 diabetes mellitus with other circulatory complication, without long-term current use of  insulin (HCC)    -Examined patient. -Continue diabetic foot care principles. -Patient to continue soft, supportive shoe gear daily. -Toenails 1-5 b/l were debrided in length and girth with sterile nail nippers and dremel without iatrogenic bleeding.  -Patient to report any pedal injuries to medical professional immediately. -Continue Vaseline Petroleum Jelly to both feet once daily avoiding application between toes. -Patient/POA to call should there be question/concern in the interim.  Return in about 3 months (around 04/02/2021).  [name], DPM

## 2021-03-17 DIAGNOSIS — E1165 Type 2 diabetes mellitus with hyperglycemia: Secondary | ICD-10-CM | POA: Diagnosis not present

## 2021-03-17 DIAGNOSIS — Z1331 Encounter for screening for depression: Secondary | ICD-10-CM | POA: Diagnosis not present

## 2021-03-17 DIAGNOSIS — I6930 Unspecified sequelae of cerebral infarction: Secondary | ICD-10-CM | POA: Diagnosis not present

## 2021-03-17 DIAGNOSIS — R5382 Chronic fatigue, unspecified: Secondary | ICD-10-CM | POA: Diagnosis not present

## 2021-03-17 DIAGNOSIS — N529 Male erectile dysfunction, unspecified: Secondary | ICD-10-CM | POA: Diagnosis not present

## 2021-03-17 DIAGNOSIS — E782 Mixed hyperlipidemia: Secondary | ICD-10-CM | POA: Diagnosis not present

## 2021-03-17 DIAGNOSIS — E11319 Type 2 diabetes mellitus with unspecified diabetic retinopathy without macular edema: Secondary | ICD-10-CM | POA: Diagnosis not present

## 2021-03-17 DIAGNOSIS — Z6841 Body Mass Index (BMI) 40.0 and over, adult: Secondary | ICD-10-CM | POA: Diagnosis not present

## 2021-03-17 DIAGNOSIS — I10 Essential (primary) hypertension: Secondary | ICD-10-CM | POA: Diagnosis not present

## 2021-03-24 DIAGNOSIS — I6930 Unspecified sequelae of cerebral infarction: Secondary | ICD-10-CM | POA: Diagnosis not present

## 2021-03-24 DIAGNOSIS — E11319 Type 2 diabetes mellitus with unspecified diabetic retinopathy without macular edema: Secondary | ICD-10-CM | POA: Diagnosis not present

## 2021-03-24 DIAGNOSIS — N1832 Chronic kidney disease, stage 3b: Secondary | ICD-10-CM | POA: Diagnosis not present

## 2021-03-24 DIAGNOSIS — E782 Mixed hyperlipidemia: Secondary | ICD-10-CM | POA: Diagnosis not present

## 2021-03-24 DIAGNOSIS — Z6841 Body Mass Index (BMI) 40.0 and over, adult: Secondary | ICD-10-CM | POA: Diagnosis not present

## 2021-03-24 DIAGNOSIS — N529 Male erectile dysfunction, unspecified: Secondary | ICD-10-CM | POA: Diagnosis not present

## 2021-03-24 DIAGNOSIS — R809 Proteinuria, unspecified: Secondary | ICD-10-CM | POA: Diagnosis not present

## 2021-03-24 DIAGNOSIS — I10 Essential (primary) hypertension: Secondary | ICD-10-CM | POA: Diagnosis not present

## 2021-04-09 DIAGNOSIS — I6930 Unspecified sequelae of cerebral infarction: Secondary | ICD-10-CM | POA: Diagnosis not present

## 2021-04-09 DIAGNOSIS — E782 Mixed hyperlipidemia: Secondary | ICD-10-CM | POA: Diagnosis not present

## 2021-04-09 DIAGNOSIS — E11319 Type 2 diabetes mellitus with unspecified diabetic retinopathy without macular edema: Secondary | ICD-10-CM | POA: Diagnosis not present

## 2021-04-09 DIAGNOSIS — Z6841 Body Mass Index (BMI) 40.0 and over, adult: Secondary | ICD-10-CM | POA: Diagnosis not present

## 2021-04-09 DIAGNOSIS — N529 Male erectile dysfunction, unspecified: Secondary | ICD-10-CM | POA: Diagnosis not present

## 2021-04-09 DIAGNOSIS — I10 Essential (primary) hypertension: Secondary | ICD-10-CM | POA: Diagnosis not present

## 2021-04-09 DIAGNOSIS — R809 Proteinuria, unspecified: Secondary | ICD-10-CM | POA: Diagnosis not present

## 2021-04-09 DIAGNOSIS — N1832 Chronic kidney disease, stage 3b: Secondary | ICD-10-CM | POA: Diagnosis not present

## 2021-04-15 ENCOUNTER — Telehealth: Payer: Self-pay | Admitting: *Deleted

## 2021-04-15 NOTE — Telephone Encounter (Signed)
Patient called and left a message and I called and left a message for the patient stating the appointment day and time and to call if any concerns or questions. Kerry Gilbert

## 2021-04-29 ENCOUNTER — Other Ambulatory Visit: Payer: Self-pay

## 2021-04-29 ENCOUNTER — Ambulatory Visit: Payer: Medicare PPO | Admitting: Podiatry

## 2021-04-29 ENCOUNTER — Encounter: Payer: Self-pay | Admitting: Podiatry

## 2021-04-29 DIAGNOSIS — E1159 Type 2 diabetes mellitus with other circulatory complications: Secondary | ICD-10-CM | POA: Diagnosis not present

## 2021-04-29 DIAGNOSIS — M79675 Pain in left toe(s): Secondary | ICD-10-CM | POA: Diagnosis not present

## 2021-04-29 DIAGNOSIS — L84 Corns and callosities: Secondary | ICD-10-CM | POA: Diagnosis not present

## 2021-04-29 DIAGNOSIS — B351 Tinea unguium: Secondary | ICD-10-CM

## 2021-04-29 DIAGNOSIS — M79674 Pain in right toe(s): Secondary | ICD-10-CM | POA: Diagnosis not present

## 2021-05-02 NOTE — Progress Notes (Signed)
Subjective: Kerry Gilbert is a pleasant 60 y.o. male patient seen today for at risk diabetic foot care with h/o NIDDM and PAD. He has painful thick toenails that are difficult to trim. Pain interferes with ambulation. Aggravating factors include wearing enclosed shoe gear. Pain is relieved with periodic professional debridement.  Patient states his blood glucose was  149 mg/dl today. He states he has been applying Vaseline Petroleum Jelly to his feet and it helps some with dryness.  [facility]P is [facility]. Last visit was: two weeks ago..  Allergies  Allergen Reactions   Metformin Diarrhea    Objective: Physical Exam  General: Kerry Gilbert is a pleasant 60 y.o. African American male, obese in NAD. AAO x 3.   Vascular:  Capillary fill time to digits <3 seconds b/l lower extremities. Palpable DP pulse(s) b/l lower extremities Nonpalpable PT pulse(s) b/l lower extremities. Pedal hair sparse. Lower extremity skin temperature gradient within normal limits. No pain with calf compression b/l.  Dermatological:  Pedal skin with normal turgor, texture and tone b/l lower extremities. No open wounds b/l lower extremities. No interdigital macerations b/l lower extremities. Toenails 1-5 b/l elongated, discolored, dystrophic, thickened, crumbly with subungual debris and tenderness to dorsal palpation. Hyperkeratotic lesion(s) plantar aspect of heel b/l feet.  No erythema, no edema, no drainage, no fluctuance.  Musculoskeletal:  Normal muscle strength 5/5 to all lower extremity muscle groups bilaterally. No pain crepitus or joint limitation noted with ROM b/l. No gross bony deformities bilaterally. Utilizes cane for ambulation assistance.  Neurological:  Protective sensation intact 5/5 intact bilaterally with 10g monofilament b/l. Vibratory sensation intact b/l.  Assessment and Plan:  1. Pain due to onychomycosis of toenails of both feet   2. Callus   3. Type 2 diabetes mellitus with  other circulatory complication, without long-term current use of insulin (HCC)     -No new findings. No new orders. -Continue diabetic foot care principles. -Patient to continue soft, supportive shoe gear daily. -Toenails 1-5 b/l were debrided in length and girth with sterile nail nippers and dremel without iatrogenic bleeding.  -Callus(es) plantar aspect of heel b/l feet pared utilizing sterile scalpel blade without complication or incident. Total number debrided =2. -Patient to report any pedal injuries to medical professional immediately. -Patient/POA to call should there be question/concern in the interim.  Return in about 3 months (around 07/30/2021).  [name], DPM

## 2021-05-12 DIAGNOSIS — E669 Obesity, unspecified: Secondary | ICD-10-CM | POA: Diagnosis not present

## 2021-05-12 DIAGNOSIS — Z6838 Body mass index (BMI) 38.0-38.9, adult: Secondary | ICD-10-CM | POA: Diagnosis not present

## 2021-05-12 DIAGNOSIS — I6930 Unspecified sequelae of cerebral infarction: Secondary | ICD-10-CM | POA: Diagnosis not present

## 2021-05-12 DIAGNOSIS — E782 Mixed hyperlipidemia: Secondary | ICD-10-CM | POA: Diagnosis not present

## 2021-05-12 DIAGNOSIS — E11319 Type 2 diabetes mellitus with unspecified diabetic retinopathy without macular edema: Secondary | ICD-10-CM | POA: Diagnosis not present

## 2021-05-12 DIAGNOSIS — N529 Male erectile dysfunction, unspecified: Secondary | ICD-10-CM | POA: Diagnosis not present

## 2021-05-12 DIAGNOSIS — I10 Essential (primary) hypertension: Secondary | ICD-10-CM | POA: Diagnosis not present

## 2021-05-12 DIAGNOSIS — R809 Proteinuria, unspecified: Secondary | ICD-10-CM | POA: Diagnosis not present

## 2021-05-12 DIAGNOSIS — N1832 Chronic kidney disease, stage 3b: Secondary | ICD-10-CM | POA: Diagnosis not present

## 2021-05-27 DIAGNOSIS — I6930 Unspecified sequelae of cerebral infarction: Secondary | ICD-10-CM | POA: Diagnosis not present

## 2021-05-27 DIAGNOSIS — N529 Male erectile dysfunction, unspecified: Secondary | ICD-10-CM | POA: Diagnosis not present

## 2021-05-27 DIAGNOSIS — N1832 Chronic kidney disease, stage 3b: Secondary | ICD-10-CM | POA: Diagnosis not present

## 2021-05-27 DIAGNOSIS — E782 Mixed hyperlipidemia: Secondary | ICD-10-CM | POA: Diagnosis not present

## 2021-05-27 DIAGNOSIS — Z6838 Body mass index (BMI) 38.0-38.9, adult: Secondary | ICD-10-CM | POA: Diagnosis not present

## 2021-05-27 DIAGNOSIS — R809 Proteinuria, unspecified: Secondary | ICD-10-CM | POA: Diagnosis not present

## 2021-05-27 DIAGNOSIS — E11319 Type 2 diabetes mellitus with unspecified diabetic retinopathy without macular edema: Secondary | ICD-10-CM | POA: Diagnosis not present

## 2021-05-27 DIAGNOSIS — I10 Essential (primary) hypertension: Secondary | ICD-10-CM | POA: Diagnosis not present

## 2021-05-27 DIAGNOSIS — E669 Obesity, unspecified: Secondary | ICD-10-CM | POA: Diagnosis not present

## 2021-06-17 DIAGNOSIS — E11319 Type 2 diabetes mellitus with unspecified diabetic retinopathy without macular edema: Secondary | ICD-10-CM | POA: Diagnosis not present

## 2021-06-17 DIAGNOSIS — R809 Proteinuria, unspecified: Secondary | ICD-10-CM | POA: Diagnosis not present

## 2021-06-17 DIAGNOSIS — N529 Male erectile dysfunction, unspecified: Secondary | ICD-10-CM | POA: Diagnosis not present

## 2021-06-17 DIAGNOSIS — E782 Mixed hyperlipidemia: Secondary | ICD-10-CM | POA: Diagnosis not present

## 2021-06-17 DIAGNOSIS — E669 Obesity, unspecified: Secondary | ICD-10-CM | POA: Diagnosis not present

## 2021-06-17 DIAGNOSIS — Z6838 Body mass index (BMI) 38.0-38.9, adult: Secondary | ICD-10-CM | POA: Diagnosis not present

## 2021-06-17 DIAGNOSIS — N1832 Chronic kidney disease, stage 3b: Secondary | ICD-10-CM | POA: Diagnosis not present

## 2021-06-17 DIAGNOSIS — I6930 Unspecified sequelae of cerebral infarction: Secondary | ICD-10-CM | POA: Diagnosis not present

## 2021-06-26 DIAGNOSIS — N529 Male erectile dysfunction, unspecified: Secondary | ICD-10-CM | POA: Diagnosis not present

## 2021-06-26 DIAGNOSIS — Z6838 Body mass index (BMI) 38.0-38.9, adult: Secondary | ICD-10-CM | POA: Diagnosis not present

## 2021-06-26 DIAGNOSIS — I10 Essential (primary) hypertension: Secondary | ICD-10-CM | POA: Diagnosis not present

## 2021-06-26 DIAGNOSIS — I6930 Unspecified sequelae of cerebral infarction: Secondary | ICD-10-CM | POA: Diagnosis not present

## 2021-06-26 DIAGNOSIS — E11319 Type 2 diabetes mellitus with unspecified diabetic retinopathy without macular edema: Secondary | ICD-10-CM | POA: Diagnosis not present

## 2021-06-26 DIAGNOSIS — E669 Obesity, unspecified: Secondary | ICD-10-CM | POA: Diagnosis not present

## 2021-06-26 DIAGNOSIS — R809 Proteinuria, unspecified: Secondary | ICD-10-CM | POA: Diagnosis not present

## 2021-06-26 DIAGNOSIS — N1832 Chronic kidney disease, stage 3b: Secondary | ICD-10-CM | POA: Diagnosis not present

## 2021-07-09 DIAGNOSIS — E785 Hyperlipidemia, unspecified: Secondary | ICD-10-CM | POA: Diagnosis not present

## 2021-07-09 DIAGNOSIS — Z9181 History of falling: Secondary | ICD-10-CM | POA: Diagnosis not present

## 2021-07-09 DIAGNOSIS — Z1211 Encounter for screening for malignant neoplasm of colon: Secondary | ICD-10-CM | POA: Diagnosis not present

## 2021-07-09 DIAGNOSIS — Z125 Encounter for screening for malignant neoplasm of prostate: Secondary | ICD-10-CM | POA: Diagnosis not present

## 2021-07-09 DIAGNOSIS — Z6838 Body mass index (BMI) 38.0-38.9, adult: Secondary | ICD-10-CM | POA: Diagnosis not present

## 2021-07-09 DIAGNOSIS — E669 Obesity, unspecified: Secondary | ICD-10-CM | POA: Diagnosis not present

## 2021-07-09 DIAGNOSIS — Z1331 Encounter for screening for depression: Secondary | ICD-10-CM | POA: Diagnosis not present

## 2021-07-09 DIAGNOSIS — Z0000 Encounter for general adult medical examination without abnormal findings: Secondary | ICD-10-CM | POA: Diagnosis not present

## 2021-07-15 DIAGNOSIS — E669 Obesity, unspecified: Secondary | ICD-10-CM | POA: Diagnosis not present

## 2021-07-15 DIAGNOSIS — N1832 Chronic kidney disease, stage 3b: Secondary | ICD-10-CM | POA: Diagnosis not present

## 2021-07-15 DIAGNOSIS — I6930 Unspecified sequelae of cerebral infarction: Secondary | ICD-10-CM | POA: Diagnosis not present

## 2021-07-15 DIAGNOSIS — N529 Male erectile dysfunction, unspecified: Secondary | ICD-10-CM | POA: Diagnosis not present

## 2021-07-15 DIAGNOSIS — R809 Proteinuria, unspecified: Secondary | ICD-10-CM | POA: Diagnosis not present

## 2021-07-15 DIAGNOSIS — E11319 Type 2 diabetes mellitus with unspecified diabetic retinopathy without macular edema: Secondary | ICD-10-CM | POA: Diagnosis not present

## 2021-07-15 DIAGNOSIS — Z6838 Body mass index (BMI) 38.0-38.9, adult: Secondary | ICD-10-CM | POA: Diagnosis not present

## 2021-07-29 DIAGNOSIS — E11319 Type 2 diabetes mellitus with unspecified diabetic retinopathy without macular edema: Secondary | ICD-10-CM | POA: Diagnosis not present

## 2021-08-06 DIAGNOSIS — E11319 Type 2 diabetes mellitus with unspecified diabetic retinopathy without macular edema: Secondary | ICD-10-CM | POA: Diagnosis not present

## 2021-08-06 DIAGNOSIS — Z6838 Body mass index (BMI) 38.0-38.9, adult: Secondary | ICD-10-CM | POA: Diagnosis not present

## 2021-08-06 DIAGNOSIS — I10 Essential (primary) hypertension: Secondary | ICD-10-CM | POA: Diagnosis not present

## 2021-08-06 DIAGNOSIS — E669 Obesity, unspecified: Secondary | ICD-10-CM | POA: Diagnosis not present

## 2021-08-06 DIAGNOSIS — I6930 Unspecified sequelae of cerebral infarction: Secondary | ICD-10-CM | POA: Diagnosis not present

## 2021-08-06 DIAGNOSIS — N1832 Chronic kidney disease, stage 3b: Secondary | ICD-10-CM | POA: Diagnosis not present

## 2021-08-06 DIAGNOSIS — N529 Male erectile dysfunction, unspecified: Secondary | ICD-10-CM | POA: Diagnosis not present

## 2021-08-06 DIAGNOSIS — R809 Proteinuria, unspecified: Secondary | ICD-10-CM | POA: Diagnosis not present

## 2021-08-12 ENCOUNTER — Encounter: Payer: Self-pay | Admitting: Podiatry

## 2021-08-12 ENCOUNTER — Ambulatory Visit: Payer: Medicare PPO | Admitting: Podiatry

## 2021-08-12 ENCOUNTER — Other Ambulatory Visit: Payer: Self-pay

## 2021-08-12 DIAGNOSIS — R3589 Other polyuria: Secondary | ICD-10-CM | POA: Insufficient documentation

## 2021-08-12 DIAGNOSIS — Z5900 Homelessness unspecified: Secondary | ICD-10-CM | POA: Insufficient documentation

## 2021-08-12 DIAGNOSIS — Z23 Encounter for immunization: Secondary | ICD-10-CM | POA: Insufficient documentation

## 2021-08-12 DIAGNOSIS — L84 Corns and callosities: Secondary | ICD-10-CM | POA: Diagnosis not present

## 2021-08-12 DIAGNOSIS — E1159 Type 2 diabetes mellitus with other circulatory complications: Secondary | ICD-10-CM

## 2021-08-12 DIAGNOSIS — N529 Male erectile dysfunction, unspecified: Secondary | ICD-10-CM | POA: Insufficient documentation

## 2021-08-12 DIAGNOSIS — M79674 Pain in right toe(s): Secondary | ICD-10-CM | POA: Diagnosis not present

## 2021-08-12 DIAGNOSIS — M79675 Pain in left toe(s): Secondary | ICD-10-CM

## 2021-08-12 DIAGNOSIS — B351 Tinea unguium: Secondary | ICD-10-CM

## 2021-08-12 DIAGNOSIS — E1165 Type 2 diabetes mellitus with hyperglycemia: Secondary | ICD-10-CM | POA: Insufficient documentation

## 2021-08-12 DIAGNOSIS — F5221 Male erectile disorder: Secondary | ICD-10-CM | POA: Insufficient documentation

## 2021-08-13 DIAGNOSIS — E11319 Type 2 diabetes mellitus with unspecified diabetic retinopathy without macular edema: Secondary | ICD-10-CM | POA: Diagnosis not present

## 2021-08-13 DIAGNOSIS — N529 Male erectile dysfunction, unspecified: Secondary | ICD-10-CM | POA: Diagnosis not present

## 2021-08-13 DIAGNOSIS — E669 Obesity, unspecified: Secondary | ICD-10-CM | POA: Diagnosis not present

## 2021-08-13 DIAGNOSIS — I10 Essential (primary) hypertension: Secondary | ICD-10-CM | POA: Diagnosis not present

## 2021-08-13 DIAGNOSIS — R809 Proteinuria, unspecified: Secondary | ICD-10-CM | POA: Diagnosis not present

## 2021-08-13 DIAGNOSIS — N1832 Chronic kidney disease, stage 3b: Secondary | ICD-10-CM | POA: Diagnosis not present

## 2021-08-13 DIAGNOSIS — E782 Mixed hyperlipidemia: Secondary | ICD-10-CM | POA: Diagnosis not present

## 2021-08-13 DIAGNOSIS — Z6838 Body mass index (BMI) 38.0-38.9, adult: Secondary | ICD-10-CM | POA: Diagnosis not present

## 2021-08-13 DIAGNOSIS — I6930 Unspecified sequelae of cerebral infarction: Secondary | ICD-10-CM | POA: Diagnosis not present

## 2021-08-17 NOTE — Progress Notes (Signed)
  Subjective:  Patient ID: Kerry Gilbert, male    DOB: 01-02-1961,  MRN: [id number]  Kerry Gilbert presents to clinic today for at risk foot care. Pt has h/o NIDDM with PAD and callus(es) of both feet and painful thick toenails that are difficult to trim. Painful toenails interfere with ambulation. Aggravating factors include wearing enclosed shoe gear. Pain is relieved with periodic professional debridement. Painful calluses are aggravated when weightbearing with and without shoegear. Pain is relieved with periodic professional debridement.  Patient states blood glucose was 130 mg/dl today.    PCP is [facility] , and last visit was September, 2022.  Allergies  Allergen Reactions   Metformin Diarrhea    Review of Systems: Negative except as noted in the HPI. Objective:   Constitutional Kerry Gilbert is a pleasant 60 y.o. African American male, in NAD. AAO x 3.   Vascular CFT <3 seconds b/l LE. Palpable DP pulse(s) b/l lower extremities Nonpalpable PT pulse(s) b/l lower extremities. Pedal hair sparse. No pain with calf compression b/l. Lower extremity skin temperature gradient within normal limits. No cyanosis or clubbing noted.  Neurologic Normal speech. Oriented to person, place, and time. Protective sensation intact 5/5 intact bilaterally with 10g monofilament b/l. Vibratory sensation intact b/l.  Dermatologic Pedal skin is warm and supple b/l LE. No open wounds b/l LE. No interdigital macerations noted b/l LE. Toenails 1-5 b/l elongated, discolored, dystrophic, thickened, crumbly with subungual debris and tenderness to dorsal palpation. Hyperkeratotic lesion(s) plantar aspect b/l heel pads.  No erythema, no edema, no drainage, no fluctuance.  Orthopedic: Normal muscle strength 5/5 to all lower extremity muscle groups bilaterally. No gross bony deformities b/l lower extremities. Utilizes cane for ambulation assistance.   Radiographs: None Assessment:   1. Pain due to  onychomycosis of toenails of both feet   2. Callus   3. Type 2 diabetes mellitus with other circulatory complication, without long-term current use of insulin (HCC)    Plan:  Patient was evaluated and treated and all questions answered. Consent given for treatment as described below: -Examined patient. -Continue diabetic foot care principles: inspect feet daily, monitor glucose as recommended by PCP and/or Endocrinologist, and follow prescribed diet per PCP, Endocrinologist and/or dietician. -Toenails 1-5 b/l were debrided in length and girth with sterile nail nippers and dremel without iatrogenic bleeding.  -Callus(es) plantar aspect b/l heel pads pared utilizing sterile scalpel blade without complication or incident. Total number debrided =2. -Patient/POA to call should there be question/concern in the interim.  Return in about 3 months (around 11/12/2021).  [name], DPM

## 2021-08-20 DIAGNOSIS — E782 Mixed hyperlipidemia: Secondary | ICD-10-CM | POA: Diagnosis not present

## 2021-08-20 DIAGNOSIS — E669 Obesity, unspecified: Secondary | ICD-10-CM | POA: Diagnosis not present

## 2021-08-20 DIAGNOSIS — E11319 Type 2 diabetes mellitus with unspecified diabetic retinopathy without macular edema: Secondary | ICD-10-CM | POA: Diagnosis not present

## 2021-08-20 DIAGNOSIS — I6930 Unspecified sequelae of cerebral infarction: Secondary | ICD-10-CM | POA: Diagnosis not present

## 2021-08-20 DIAGNOSIS — N529 Male erectile dysfunction, unspecified: Secondary | ICD-10-CM | POA: Diagnosis not present

## 2021-08-20 DIAGNOSIS — I10 Essential (primary) hypertension: Secondary | ICD-10-CM | POA: Diagnosis not present

## 2021-08-20 DIAGNOSIS — N1832 Chronic kidney disease, stage 3b: Secondary | ICD-10-CM | POA: Diagnosis not present

## 2021-08-20 DIAGNOSIS — Z6838 Body mass index (BMI) 38.0-38.9, adult: Secondary | ICD-10-CM | POA: Diagnosis not present

## 2021-08-20 DIAGNOSIS — R809 Proteinuria, unspecified: Secondary | ICD-10-CM | POA: Diagnosis not present

## 2021-08-27 DIAGNOSIS — I6930 Unspecified sequelae of cerebral infarction: Secondary | ICD-10-CM | POA: Diagnosis not present

## 2021-08-27 DIAGNOSIS — R809 Proteinuria, unspecified: Secondary | ICD-10-CM | POA: Diagnosis not present

## 2021-08-27 DIAGNOSIS — Z6838 Body mass index (BMI) 38.0-38.9, adult: Secondary | ICD-10-CM | POA: Diagnosis not present

## 2021-08-27 DIAGNOSIS — E669 Obesity, unspecified: Secondary | ICD-10-CM | POA: Diagnosis not present

## 2021-08-27 DIAGNOSIS — N1832 Chronic kidney disease, stage 3b: Secondary | ICD-10-CM | POA: Diagnosis not present

## 2021-08-27 DIAGNOSIS — I10 Essential (primary) hypertension: Secondary | ICD-10-CM | POA: Diagnosis not present

## 2021-08-27 DIAGNOSIS — N529 Male erectile dysfunction, unspecified: Secondary | ICD-10-CM | POA: Diagnosis not present

## 2021-08-27 DIAGNOSIS — E11319 Type 2 diabetes mellitus with unspecified diabetic retinopathy without macular edema: Secondary | ICD-10-CM | POA: Diagnosis not present

## 2021-10-01 DIAGNOSIS — E11319 Type 2 diabetes mellitus with unspecified diabetic retinopathy without macular edema: Secondary | ICD-10-CM | POA: Diagnosis not present

## 2021-10-01 DIAGNOSIS — I6930 Unspecified sequelae of cerebral infarction: Secondary | ICD-10-CM | POA: Diagnosis not present

## 2021-10-01 DIAGNOSIS — R809 Proteinuria, unspecified: Secondary | ICD-10-CM | POA: Diagnosis not present

## 2021-10-01 DIAGNOSIS — Z6837 Body mass index (BMI) 37.0-37.9, adult: Secondary | ICD-10-CM | POA: Diagnosis not present

## 2021-10-01 DIAGNOSIS — N529 Male erectile dysfunction, unspecified: Secondary | ICD-10-CM | POA: Diagnosis not present

## 2021-10-01 DIAGNOSIS — N1832 Chronic kidney disease, stage 3b: Secondary | ICD-10-CM | POA: Diagnosis not present

## 2021-10-01 DIAGNOSIS — E669 Obesity, unspecified: Secondary | ICD-10-CM | POA: Diagnosis not present

## 2021-10-01 DIAGNOSIS — I10 Essential (primary) hypertension: Secondary | ICD-10-CM | POA: Diagnosis not present

## 2021-10-22 DIAGNOSIS — E11319 Type 2 diabetes mellitus with unspecified diabetic retinopathy without macular edema: Secondary | ICD-10-CM | POA: Diagnosis not present

## 2021-10-22 DIAGNOSIS — I10 Essential (primary) hypertension: Secondary | ICD-10-CM | POA: Diagnosis not present

## 2021-10-22 DIAGNOSIS — I6930 Unspecified sequelae of cerebral infarction: Secondary | ICD-10-CM | POA: Diagnosis not present

## 2021-10-22 DIAGNOSIS — E669 Obesity, unspecified: Secondary | ICD-10-CM | POA: Diagnosis not present

## 2021-10-22 DIAGNOSIS — N1832 Chronic kidney disease, stage 3b: Secondary | ICD-10-CM | POA: Diagnosis not present

## 2021-10-22 DIAGNOSIS — Z6837 Body mass index (BMI) 37.0-37.9, adult: Secondary | ICD-10-CM | POA: Diagnosis not present

## 2021-10-22 DIAGNOSIS — R809 Proteinuria, unspecified: Secondary | ICD-10-CM | POA: Diagnosis not present

## 2021-10-22 DIAGNOSIS — N529 Male erectile dysfunction, unspecified: Secondary | ICD-10-CM | POA: Diagnosis not present

## 2021-10-28 ENCOUNTER — Ambulatory Visit: Payer: Medicare PPO | Admitting: Podiatry

## 2021-11-19 DIAGNOSIS — Z6837 Body mass index (BMI) 37.0-37.9, adult: Secondary | ICD-10-CM | POA: Diagnosis not present

## 2021-11-19 DIAGNOSIS — N529 Male erectile dysfunction, unspecified: Secondary | ICD-10-CM | POA: Diagnosis not present

## 2021-11-19 DIAGNOSIS — E11319 Type 2 diabetes mellitus with unspecified diabetic retinopathy without macular edema: Secondary | ICD-10-CM | POA: Diagnosis not present

## 2021-11-19 DIAGNOSIS — R809 Proteinuria, unspecified: Secondary | ICD-10-CM | POA: Diagnosis not present

## 2021-11-19 DIAGNOSIS — E782 Mixed hyperlipidemia: Secondary | ICD-10-CM | POA: Diagnosis not present

## 2021-11-19 DIAGNOSIS — N1832 Chronic kidney disease, stage 3b: Secondary | ICD-10-CM | POA: Diagnosis not present

## 2021-11-19 DIAGNOSIS — I6930 Unspecified sequelae of cerebral infarction: Secondary | ICD-10-CM | POA: Diagnosis not present

## 2021-11-19 DIAGNOSIS — E669 Obesity, unspecified: Secondary | ICD-10-CM | POA: Diagnosis not present

## 2021-11-19 DIAGNOSIS — I10 Essential (primary) hypertension: Secondary | ICD-10-CM | POA: Diagnosis not present

## 2021-11-26 DIAGNOSIS — E782 Mixed hyperlipidemia: Secondary | ICD-10-CM | POA: Diagnosis not present

## 2021-11-26 DIAGNOSIS — E11319 Type 2 diabetes mellitus with unspecified diabetic retinopathy without macular edema: Secondary | ICD-10-CM | POA: Diagnosis not present

## 2021-11-26 DIAGNOSIS — N1832 Chronic kidney disease, stage 3b: Secondary | ICD-10-CM | POA: Diagnosis not present

## 2021-11-26 DIAGNOSIS — I6930 Unspecified sequelae of cerebral infarction: Secondary | ICD-10-CM | POA: Diagnosis not present

## 2021-11-26 DIAGNOSIS — E669 Obesity, unspecified: Secondary | ICD-10-CM | POA: Diagnosis not present

## 2021-11-26 DIAGNOSIS — R809 Proteinuria, unspecified: Secondary | ICD-10-CM | POA: Diagnosis not present

## 2021-11-26 DIAGNOSIS — I10 Essential (primary) hypertension: Secondary | ICD-10-CM | POA: Diagnosis not present

## 2021-11-26 DIAGNOSIS — N529 Male erectile dysfunction, unspecified: Secondary | ICD-10-CM | POA: Diagnosis not present

## 2021-11-26 DIAGNOSIS — Z6837 Body mass index (BMI) 37.0-37.9, adult: Secondary | ICD-10-CM | POA: Diagnosis not present

## 2021-12-17 DIAGNOSIS — Z6837 Body mass index (BMI) 37.0-37.9, adult: Secondary | ICD-10-CM | POA: Diagnosis not present

## 2021-12-17 DIAGNOSIS — E782 Mixed hyperlipidemia: Secondary | ICD-10-CM | POA: Diagnosis not present

## 2021-12-17 DIAGNOSIS — I6930 Unspecified sequelae of cerebral infarction: Secondary | ICD-10-CM | POA: Diagnosis not present

## 2021-12-17 DIAGNOSIS — I10 Essential (primary) hypertension: Secondary | ICD-10-CM | POA: Diagnosis not present

## 2021-12-17 DIAGNOSIS — E11319 Type 2 diabetes mellitus with unspecified diabetic retinopathy without macular edema: Secondary | ICD-10-CM | POA: Diagnosis not present

## 2021-12-17 DIAGNOSIS — N1832 Chronic kidney disease, stage 3b: Secondary | ICD-10-CM | POA: Diagnosis not present

## 2021-12-17 DIAGNOSIS — N529 Male erectile dysfunction, unspecified: Secondary | ICD-10-CM | POA: Diagnosis not present

## 2021-12-17 DIAGNOSIS — E669 Obesity, unspecified: Secondary | ICD-10-CM | POA: Diagnosis not present

## 2021-12-17 DIAGNOSIS — R809 Proteinuria, unspecified: Secondary | ICD-10-CM | POA: Diagnosis not present

## 2021-12-28 DIAGNOSIS — Z8673 Personal history of transient ischemic attack (TIA), and cerebral infarction without residual deficits: Secondary | ICD-10-CM | POA: Diagnosis not present

## 2021-12-28 DIAGNOSIS — K828 Other specified diseases of gallbladder: Secondary | ICD-10-CM | POA: Diagnosis not present

## 2021-12-28 DIAGNOSIS — R739 Hyperglycemia, unspecified: Secondary | ICD-10-CM | POA: Diagnosis not present

## 2021-12-28 DIAGNOSIS — N179 Acute kidney failure, unspecified: Secondary | ICD-10-CM | POA: Diagnosis not present

## 2021-12-28 DIAGNOSIS — R000 Tachycardia, unspecified: Secondary | ICD-10-CM | POA: Diagnosis not present

## 2021-12-28 DIAGNOSIS — R1011 Right upper quadrant pain: Secondary | ICD-10-CM | POA: Diagnosis not present

## 2021-12-28 DIAGNOSIS — I700 Atherosclerosis of aorta: Secondary | ICD-10-CM | POA: Diagnosis not present

## 2021-12-28 DIAGNOSIS — Z79899 Other long term (current) drug therapy: Secondary | ICD-10-CM | POA: Diagnosis not present

## 2021-12-28 DIAGNOSIS — R197 Diarrhea, unspecified: Secondary | ICD-10-CM | POA: Diagnosis not present

## 2021-12-28 DIAGNOSIS — E1165 Type 2 diabetes mellitus with hyperglycemia: Secondary | ICD-10-CM | POA: Diagnosis not present

## 2021-12-28 DIAGNOSIS — K6389 Other specified diseases of intestine: Secondary | ICD-10-CM | POA: Diagnosis not present

## 2021-12-28 DIAGNOSIS — K810 Acute cholecystitis: Secondary | ICD-10-CM | POA: Diagnosis not present

## 2021-12-28 DIAGNOSIS — A419 Sepsis, unspecified organism: Secondary | ICD-10-CM | POA: Diagnosis not present

## 2021-12-28 DIAGNOSIS — R112 Nausea with vomiting, unspecified: Secondary | ICD-10-CM | POA: Diagnosis not present

## 2021-12-28 DIAGNOSIS — M1990 Unspecified osteoarthritis, unspecified site: Secondary | ICD-10-CM | POA: Diagnosis not present

## 2021-12-28 DIAGNOSIS — J9811 Atelectasis: Secondary | ICD-10-CM | POA: Diagnosis not present

## 2021-12-28 DIAGNOSIS — E871 Hypo-osmolality and hyponatremia: Secondary | ICD-10-CM | POA: Diagnosis not present

## 2022-01-10 DIAGNOSIS — K810 Acute cholecystitis: Secondary | ICD-10-CM | POA: Diagnosis not present

## 2022-01-10 DIAGNOSIS — E11319 Type 2 diabetes mellitus with unspecified diabetic retinopathy without macular edema: Secondary | ICD-10-CM | POA: Diagnosis not present

## 2022-01-10 DIAGNOSIS — I6930 Unspecified sequelae of cerebral infarction: Secondary | ICD-10-CM | POA: Diagnosis not present

## 2022-01-10 DIAGNOSIS — N529 Male erectile dysfunction, unspecified: Secondary | ICD-10-CM | POA: Diagnosis not present

## 2022-01-10 DIAGNOSIS — N179 Acute kidney failure, unspecified: Secondary | ICD-10-CM | POA: Diagnosis not present

## 2022-01-10 DIAGNOSIS — E782 Mixed hyperlipidemia: Secondary | ICD-10-CM | POA: Diagnosis not present

## 2022-01-10 DIAGNOSIS — I10 Essential (primary) hypertension: Secondary | ICD-10-CM | POA: Diagnosis not present

## 2022-01-10 DIAGNOSIS — N189 Chronic kidney disease, unspecified: Secondary | ICD-10-CM | POA: Diagnosis not present

## 2022-01-10 DIAGNOSIS — R112 Nausea with vomiting, unspecified: Secondary | ICD-10-CM | POA: Diagnosis not present

## 2022-01-12 DIAGNOSIS — R112 Nausea with vomiting, unspecified: Secondary | ICD-10-CM | POA: Diagnosis not present

## 2022-01-12 DIAGNOSIS — I10 Essential (primary) hypertension: Secondary | ICD-10-CM | POA: Diagnosis not present

## 2022-01-12 DIAGNOSIS — N179 Acute kidney failure, unspecified: Secondary | ICD-10-CM | POA: Diagnosis not present

## 2022-01-12 DIAGNOSIS — Z79899 Other long term (current) drug therapy: Secondary | ICD-10-CM | POA: Diagnosis not present

## 2022-01-12 DIAGNOSIS — E782 Mixed hyperlipidemia: Secondary | ICD-10-CM | POA: Diagnosis not present

## 2022-01-12 DIAGNOSIS — N529 Male erectile dysfunction, unspecified: Secondary | ICD-10-CM | POA: Diagnosis not present

## 2022-01-12 DIAGNOSIS — E11319 Type 2 diabetes mellitus with unspecified diabetic retinopathy without macular edema: Secondary | ICD-10-CM | POA: Diagnosis not present

## 2022-01-12 DIAGNOSIS — K810 Acute cholecystitis: Secondary | ICD-10-CM | POA: Diagnosis not present

## 2022-01-12 DIAGNOSIS — N189 Chronic kidney disease, unspecified: Secondary | ICD-10-CM | POA: Diagnosis not present

## 2022-01-27 ENCOUNTER — Ambulatory Visit (INDEPENDENT_AMBULATORY_CARE_PROVIDER_SITE_OTHER): Payer: Medicare PPO | Admitting: Podiatry

## 2022-01-27 ENCOUNTER — Encounter: Payer: Self-pay | Admitting: Podiatry

## 2022-01-27 DIAGNOSIS — E119 Type 2 diabetes mellitus without complications: Secondary | ICD-10-CM | POA: Diagnosis not present

## 2022-01-27 DIAGNOSIS — M79674 Pain in right toe(s): Secondary | ICD-10-CM

## 2022-01-27 DIAGNOSIS — M79675 Pain in left toe(s): Secondary | ICD-10-CM

## 2022-01-27 DIAGNOSIS — M2041 Other hammer toe(s) (acquired), right foot: Secondary | ICD-10-CM | POA: Diagnosis not present

## 2022-01-27 DIAGNOSIS — M2142 Flat foot [pes planus] (acquired), left foot: Secondary | ICD-10-CM

## 2022-01-27 DIAGNOSIS — M2011 Hallux valgus (acquired), right foot: Secondary | ICD-10-CM

## 2022-01-27 DIAGNOSIS — E1165 Type 2 diabetes mellitus with hyperglycemia: Secondary | ICD-10-CM | POA: Diagnosis not present

## 2022-01-27 DIAGNOSIS — M2012 Hallux valgus (acquired), left foot: Secondary | ICD-10-CM | POA: Diagnosis not present

## 2022-01-27 DIAGNOSIS — B351 Tinea unguium: Secondary | ICD-10-CM | POA: Diagnosis not present

## 2022-01-27 DIAGNOSIS — M2141 Flat foot [pes planus] (acquired), right foot: Secondary | ICD-10-CM | POA: Diagnosis not present

## 2022-01-27 DIAGNOSIS — M2042 Other hammer toe(s) (acquired), left foot: Secondary | ICD-10-CM

## 2022-01-27 NOTE — Patient Instructions (Signed)
It was great to see you today! You had your annual diabetic foot examination performed today. You will notice a charge for an office visit on today's billing and this is for your annual diabetic foot exam. It is done once yearly. ?

## 2022-01-28 DIAGNOSIS — K810 Acute cholecystitis: Secondary | ICD-10-CM | POA: Diagnosis not present

## 2022-02-06 ENCOUNTER — Encounter: Payer: Self-pay | Admitting: Podiatry

## 2022-02-06 NOTE — Progress Notes (Signed)
ANNUAL DIABETIC FOOT EXAM ? ?Subjective: ?Kerry Gilbert presents today for annual diabetic foot examination. ? ?Patient relates 61 year h/o diabetes. ? ?Patient denies any h/o foot wounds. ? ?Patient denies any numbness, tingling, burning, or pins/needle sensation in feet. ? ?Patient's blood sugar was 120 mg/dl today.  ? ?Risk factors: diabetes, h/o CVA, HTN. ? ?[facility] is patient's PCP. Last visit was March, 2023. ? ?Patient states he was recently hospitalized for 3 days for infected gall bladder. ? ?Past Medical History:  ?Diagnosis Date  ? Blindness of right eye   ? Detached retina   ? Diabetes (HCC)   ? Hypertension   ? Stroke (cerebrum) (HCC)   ? ?Patient Active Problem List  ? Diagnosis Date Noted  ? Encounter for immunization 08/12/2021  ? Homelessness 08/12/2021  ? Male erectile disorder (CODE) 08/12/2021  ? Erectile dysfunction 08/12/2021  ? Morbid (severe) obesity due to excess calories (HCC) 08/12/2021  ? Other polyuria 08/12/2021  ? Type 2 diabetes mellitus with hyperglycemia (HCC) 08/12/2021  ? Vision changes 03/19/2019  ? Acute ischemic stroke (HCC) 03/14/2019  ? Vision, loss, sudden, left 03/14/2019  ? HTN (hypertension) 03/14/2019  ? Blindness of right eye 03/14/2019  ? DM2 (diabetes mellitus, type 2) (HCC) 03/14/2019  ? Renal insufficiency 03/14/2019  ? ?History reviewed. No pertinent surgical history. ?Current Outpatient Medications on File Prior to Visit  ?Medication Sig Dispense Refill  ? amLODipine (NORVASC) 10 MG tablet Take 1 tablet by mouth daily.    ? amlodipine-atorvastatin (CADUET) 10-20 MG tablet Take 1 tablet by mouth daily.    ? aspirin 81 MG chewable tablet Chew by mouth.    ? aspirin EC 81 MG tablet Take 81 mg by mouth daily.    ? atorvastatin (LIPITOR) 80 MG tablet Take 80 mg by mouth at bedtime.    ? empagliflozin (JARDIANCE) 25 MG TABS tablet Take by mouth.    ? ezetimibe (ZETIA) 10 MG tablet Take by mouth.    ? liraglutide (VICTOZA) 18 MG/3ML SOPN Inject into  the skin.    ? lisinopril (ZESTRIL) 40 MG tablet Take by mouth.    ? sildenafil (VIAGRA) 100 MG tablet Take by mouth.    ? traZODone (DESYREL) 50 MG tablet Take 1 tablet by mouth at bedtime as needed.    ? ?No current facility-administered medications on file prior to visit.  ?  ?Allergies  ?Allergen Reactions  ? Metformin Diarrhea  ? ?Social History  ? ?Occupational History  ? Not on file  ?Tobacco Use  ? Smoking status: Never  ? Smokeless tobacco: Never  ?Substance and Sexual Activity  ? Alcohol use: Never  ? Drug use: Not on file  ? Sexual activity: Not on file  ? ?Family History  ?Problem Relation Age of Onset  ? Hypertension Mother   ? Hypertension Father   ? ? ?There is no immunization history on file for this patient.  ? ?Review of Systems: Negative except as noted in the HPI.  ? ?Objective: ?There were no vitals filed for this visit. ? ?Kerry Gilbert is a pleasant 61 y.o. male in NAD. AAO X 3. ? ?Vascular Examination: ?CFT <3 seconds b/l LE. Faintly palpable DP pulses b/l LE. Faintly palpable PT pulse(s) b/l LE. Pedal hair absent. No pain with calf compression b/l. No edema noted b/l LE. No cyanosis or clubbing noted b/l LE. ? ?Dermatological Examination: ?Pedal skin is warm and supple b/l LE. No open wounds b/l LE. No interdigital macerations noted  b/l LE. Toenails 1-5 b/l elongated, discolored, dystrophic, thickened, crumbly with subungual debris and tenderness to dorsal palpation. No hyperkeratotic nor porokeratotic lesions present on today's visit. ? ?Neurological Examination: ?Protective sensation intact 5/5 intact bilaterally with 10g monofilament b/l. Vibratory sensation intact b/l. ? ?Musculoskeletal Examination: ?Muscle strength 5/5 to all lower extremity muscle groups bilaterally. HAV with bunion deformity noted b/l LE. Hammertoe deformity noted 2-5 b/l. Pes planus deformity noted bilateral LE. ? ?Footwear Assessment: ?Does the patient wear appropriate shoes? Yes. ?Does the patient need  inserts/orthotics? Yes.. ? ?Assessment: ?1. Pain due to onychomycosis of toenails of both feet   ?2. Hallux valgus, acquired, bilateral   ?3. Acquired hammertoes of both feet   ?4. Pes planus of both feet   ?5. Type 2 diabetes mellitus with hyperglycemia, without long-term current use of insulin (HCC)   ?6. Encounter for diabetic foot exam (HCC)   ?  ?ADA Risk Categorization: ? ?Low Risk :  ?Patient has all of the following: ?Intact protective sensation ?No prior foot ulcer  ?No severe deformity ?Pedal pulses present ? ?Plan: ?-Patient was evaluated and treated. All patient's and/or POA's questions/concerns answered on today's visit. ?-Diabetic foot examination performed today. ?-Continue foot and shoe inspections daily. Monitor blood glucose per PCP/Endocrinologist's recommendations. ?-Mycotic toenails 1-5 bilaterally were debrided in length and girth with sterile nail nippers and dremel without incident. ?-Patient/POA to call should there be question/concern in the interim. ?Return in about 3 months (around 04/28/2022). ? ?[name], DPM ?

## 2022-02-07 DIAGNOSIS — R112 Nausea with vomiting, unspecified: Secondary | ICD-10-CM | POA: Diagnosis not present

## 2022-02-07 DIAGNOSIS — K810 Acute cholecystitis: Secondary | ICD-10-CM | POA: Diagnosis not present

## 2022-02-07 DIAGNOSIS — N529 Male erectile dysfunction, unspecified: Secondary | ICD-10-CM | POA: Diagnosis not present

## 2022-02-07 DIAGNOSIS — N1832 Chronic kidney disease, stage 3b: Secondary | ICD-10-CM | POA: Diagnosis not present

## 2022-02-07 DIAGNOSIS — Z01810 Encounter for preprocedural cardiovascular examination: Secondary | ICD-10-CM | POA: Diagnosis not present

## 2022-02-07 DIAGNOSIS — I10 Essential (primary) hypertension: Secondary | ICD-10-CM | POA: Diagnosis not present

## 2022-02-07 DIAGNOSIS — I6930 Unspecified sequelae of cerebral infarction: Secondary | ICD-10-CM | POA: Diagnosis not present

## 2022-02-07 DIAGNOSIS — E11319 Type 2 diabetes mellitus with unspecified diabetic retinopathy without macular edema: Secondary | ICD-10-CM | POA: Diagnosis not present

## 2022-02-07 DIAGNOSIS — E782 Mixed hyperlipidemia: Secondary | ICD-10-CM | POA: Diagnosis not present

## 2022-02-10 DIAGNOSIS — Z48815 Encounter for surgical aftercare following surgery on the digestive system: Secondary | ICD-10-CM | POA: Diagnosis not present

## 2022-02-10 DIAGNOSIS — I6930 Unspecified sequelae of cerebral infarction: Secondary | ICD-10-CM | POA: Diagnosis not present

## 2022-02-10 DIAGNOSIS — K8010 Calculus of gallbladder with chronic cholecystitis without obstruction: Secondary | ICD-10-CM | POA: Diagnosis not present

## 2022-02-10 DIAGNOSIS — Z7982 Long term (current) use of aspirin: Secondary | ICD-10-CM | POA: Diagnosis not present

## 2022-02-10 DIAGNOSIS — I129 Hypertensive chronic kidney disease with stage 1 through stage 4 chronic kidney disease, or unspecified chronic kidney disease: Secondary | ICD-10-CM | POA: Diagnosis not present

## 2022-02-10 DIAGNOSIS — E1122 Type 2 diabetes mellitus with diabetic chronic kidney disease: Secondary | ICD-10-CM | POA: Diagnosis not present

## 2022-02-10 DIAGNOSIS — E119 Type 2 diabetes mellitus without complications: Secondary | ICD-10-CM | POA: Diagnosis not present

## 2022-02-10 DIAGNOSIS — K812 Acute cholecystitis with chronic cholecystitis: Secondary | ICD-10-CM | POA: Diagnosis not present

## 2022-02-10 DIAGNOSIS — N1832 Chronic kidney disease, stage 3b: Secondary | ICD-10-CM | POA: Diagnosis not present

## 2022-02-10 DIAGNOSIS — I10 Essential (primary) hypertension: Secondary | ICD-10-CM | POA: Diagnosis not present

## 2022-02-10 DIAGNOSIS — Z7985 Long-term (current) use of injectable non-insulin antidiabetic drugs: Secondary | ICD-10-CM | POA: Diagnosis not present

## 2022-03-07 DIAGNOSIS — N529 Male erectile dysfunction, unspecified: Secondary | ICD-10-CM | POA: Diagnosis not present

## 2022-03-07 DIAGNOSIS — E782 Mixed hyperlipidemia: Secondary | ICD-10-CM | POA: Diagnosis not present

## 2022-03-07 DIAGNOSIS — I10 Essential (primary) hypertension: Secondary | ICD-10-CM | POA: Diagnosis not present

## 2022-03-07 DIAGNOSIS — E11319 Type 2 diabetes mellitus with unspecified diabetic retinopathy without macular edema: Secondary | ICD-10-CM | POA: Diagnosis not present

## 2022-03-07 DIAGNOSIS — K810 Acute cholecystitis: Secondary | ICD-10-CM | POA: Diagnosis not present

## 2022-03-07 DIAGNOSIS — I6930 Unspecified sequelae of cerebral infarction: Secondary | ICD-10-CM | POA: Diagnosis not present

## 2022-03-07 DIAGNOSIS — R809 Proteinuria, unspecified: Secondary | ICD-10-CM | POA: Diagnosis not present

## 2022-03-07 DIAGNOSIS — R112 Nausea with vomiting, unspecified: Secondary | ICD-10-CM | POA: Diagnosis not present

## 2022-03-07 DIAGNOSIS — Z79899 Other long term (current) drug therapy: Secondary | ICD-10-CM | POA: Diagnosis not present

## 2022-04-25 DIAGNOSIS — N529 Male erectile dysfunction, unspecified: Secondary | ICD-10-CM | POA: Diagnosis not present

## 2022-04-25 DIAGNOSIS — E782 Mixed hyperlipidemia: Secondary | ICD-10-CM | POA: Diagnosis not present

## 2022-04-25 DIAGNOSIS — R112 Nausea with vomiting, unspecified: Secondary | ICD-10-CM | POA: Diagnosis not present

## 2022-04-25 DIAGNOSIS — R42 Dizziness and giddiness: Secondary | ICD-10-CM | POA: Diagnosis not present

## 2022-04-25 DIAGNOSIS — K810 Acute cholecystitis: Secondary | ICD-10-CM | POA: Diagnosis not present

## 2022-04-25 DIAGNOSIS — E11319 Type 2 diabetes mellitus with unspecified diabetic retinopathy without macular edema: Secondary | ICD-10-CM | POA: Diagnosis not present

## 2022-04-25 DIAGNOSIS — I10 Essential (primary) hypertension: Secondary | ICD-10-CM | POA: Diagnosis not present

## 2022-04-25 DIAGNOSIS — I6930 Unspecified sequelae of cerebral infarction: Secondary | ICD-10-CM | POA: Diagnosis not present

## 2022-04-25 DIAGNOSIS — R809 Proteinuria, unspecified: Secondary | ICD-10-CM | POA: Diagnosis not present

## 2022-04-28 ENCOUNTER — Ambulatory Visit: Payer: Medicare PPO | Admitting: Podiatry

## 2022-04-29 DIAGNOSIS — I10 Essential (primary) hypertension: Secondary | ICD-10-CM | POA: Diagnosis not present

## 2022-04-29 DIAGNOSIS — E782 Mixed hyperlipidemia: Secondary | ICD-10-CM | POA: Diagnosis not present

## 2022-04-29 DIAGNOSIS — I6930 Unspecified sequelae of cerebral infarction: Secondary | ICD-10-CM | POA: Diagnosis not present

## 2022-04-29 DIAGNOSIS — K810 Acute cholecystitis: Secondary | ICD-10-CM | POA: Diagnosis not present

## 2022-04-29 DIAGNOSIS — R809 Proteinuria, unspecified: Secondary | ICD-10-CM | POA: Diagnosis not present

## 2022-04-29 DIAGNOSIS — E11319 Type 2 diabetes mellitus with unspecified diabetic retinopathy without macular edema: Secondary | ICD-10-CM | POA: Diagnosis not present

## 2022-04-29 DIAGNOSIS — R112 Nausea with vomiting, unspecified: Secondary | ICD-10-CM | POA: Diagnosis not present

## 2022-04-29 DIAGNOSIS — R42 Dizziness and giddiness: Secondary | ICD-10-CM | POA: Diagnosis not present

## 2022-04-29 DIAGNOSIS — N529 Male erectile dysfunction, unspecified: Secondary | ICD-10-CM | POA: Diagnosis not present

## 2022-06-02 DIAGNOSIS — E11319 Type 2 diabetes mellitus with unspecified diabetic retinopathy without macular edema: Secondary | ICD-10-CM | POA: Diagnosis not present

## 2022-06-02 DIAGNOSIS — Z8673 Personal history of transient ischemic attack (TIA), and cerebral infarction without residual deficits: Secondary | ICD-10-CM | POA: Diagnosis not present

## 2022-06-02 DIAGNOSIS — N184 Chronic kidney disease, stage 4 (severe): Secondary | ICD-10-CM | POA: Diagnosis not present

## 2022-06-02 DIAGNOSIS — N529 Male erectile dysfunction, unspecified: Secondary | ICD-10-CM | POA: Diagnosis not present

## 2022-06-02 DIAGNOSIS — E1122 Type 2 diabetes mellitus with diabetic chronic kidney disease: Secondary | ICD-10-CM | POA: Diagnosis not present

## 2022-06-02 DIAGNOSIS — R5382 Chronic fatigue, unspecified: Secondary | ICD-10-CM | POA: Diagnosis not present

## 2022-06-02 DIAGNOSIS — I10 Essential (primary) hypertension: Secondary | ICD-10-CM | POA: Diagnosis not present

## 2022-06-02 DIAGNOSIS — E782 Mixed hyperlipidemia: Secondary | ICD-10-CM | POA: Diagnosis not present

## 2022-06-02 DIAGNOSIS — R809 Proteinuria, unspecified: Secondary | ICD-10-CM | POA: Diagnosis not present

## 2022-06-09 ENCOUNTER — Ambulatory Visit: Payer: Medicare PPO | Admitting: Podiatry

## 2022-09-22 ENCOUNTER — Ambulatory Visit: Payer: Medicare PPO | Admitting: Podiatry

## 2022-11-10 ENCOUNTER — Ambulatory Visit: Payer: Medicare PPO | Admitting: Podiatry

## 2022-11-10 ENCOUNTER — Encounter: Payer: Self-pay | Admitting: Podiatry

## 2022-11-10 VITALS — BP 138/82

## 2022-11-10 DIAGNOSIS — M79674 Pain in right toe(s): Secondary | ICD-10-CM | POA: Diagnosis not present

## 2022-11-10 DIAGNOSIS — B351 Tinea unguium: Secondary | ICD-10-CM | POA: Diagnosis not present

## 2022-11-10 DIAGNOSIS — E1165 Type 2 diabetes mellitus with hyperglycemia: Secondary | ICD-10-CM | POA: Diagnosis not present

## 2022-11-10 DIAGNOSIS — M79675 Pain in left toe(s): Secondary | ICD-10-CM | POA: Diagnosis not present

## 2022-11-13 NOTE — Progress Notes (Signed)
  Subjective:  Patient ID: Kerry Gilbert, male    DOB: 06/16/1961,  MRN: [id number]  Kerry Gilbert presents to clinic today for: preventative diabetic foot care and painful elongated mycotic toenails 1-5 bilaterally which are tender when wearing enclosed shoe gear. Pain is relieved with periodic professional debridement. Chief Complaint  Patient presents with   Nail Problem    DFC  BG - 124 this morning  A1C - 6.5 PCP - Dr Kelton Pillar, last OV 06/2022  .  PCP is [facility].  Allergies  Allergen Reactions   Metformin Diarrhea    Review of Systems: Negative except as noted in the HPI.  Objective: No changes noted in today's physical examination. Vitals:   11/10/22 1115  BP: 138/82   Kerry Gilbert is a pleasant 62 y.o. male in NAD. AAO x 3.  Vascular Examination: Capillary refill time <3 seconds b/l LE. Palpable pedal pulses b/l LE. Digital hair absent b/l. No pedal edema b/l. Skin temperature gradient WNL b/l. No varicosities b/l. No ischemia or gangrene noted b/l LE. No cyanosis or clubbing noted b/l LE.Marland Kitchen  Dermatological Examination: Pedal skin with normal turgor, texture and tone b/l. No open wounds. No interdigital macerations b/l. Toenails 1-5 b/l thickened, discolored, dystrophic with subungual debris. There is pain on palpation to dorsal aspect of nailplates. No hyperkeratotic nor porokeratotic lesions present on today's visit.Marland Kitchen  Neurological Examination: Protective sensation intact with 10 gram monofilament b/l LE. Vibratory sensation intact b/l LE.   Musculoskeletal Examination: Muscle strength 5/5 to all LE muscle groups b/l. HAV with bunion bilaterally and hammertoes 2-5 b/l. Pes planus deformity noted bilateral LE.  Assessment/Plan: 1. Pain due to onychomycosis of toenails of both feet   2. Type 2 diabetes mellitus with hyperglycemia, without long-term current use of insulin ([place])     -Consent given for treatment as described below: -Examined  patient. -Continue supportive shoe gear daily. -Toenails 1-5 b/l were debrided in length and girth with sterile nail nippers and dremel without iatrogenic bleeding.  -Patient/POA to call should there be question/concern in the interim.   Return in about 3 months (around 02/09/2023).  Marzetta Board, DPM

## 2023-01-11 ENCOUNTER — Ambulatory Visit: Payer: Medicare PPO | Admitting: Podiatry

## 2023-01-25 ENCOUNTER — Ambulatory Visit: Payer: Medicare PPO | Admitting: Podiatry

## 2023-02-01 ENCOUNTER — Ambulatory Visit (INDEPENDENT_AMBULATORY_CARE_PROVIDER_SITE_OTHER): Payer: Medicare PPO | Admitting: Podiatry

## 2023-02-01 ENCOUNTER — Encounter: Payer: Self-pay | Admitting: Podiatry

## 2023-02-01 DIAGNOSIS — E1165 Type 2 diabetes mellitus with hyperglycemia: Secondary | ICD-10-CM

## 2023-02-01 DIAGNOSIS — B351 Tinea unguium: Secondary | ICD-10-CM

## 2023-02-01 DIAGNOSIS — M79674 Pain in right toe(s): Secondary | ICD-10-CM | POA: Diagnosis not present

## 2023-02-01 DIAGNOSIS — M79675 Pain in left toe(s): Secondary | ICD-10-CM | POA: Diagnosis not present

## 2023-02-01 NOTE — Progress Notes (Signed)
  Subjective:  Patient ID: Kerry Gilbert, male    DOB: [birth year],  MRN: [id number]  Kerry Gilbert presents to clinic today for: preventative diabetic foot care and painful elongated mycotic toenails 1-5 bilaterally which are tender when wearing enclosed shoe gear. Pain is relieved with periodic professional debridement. No chief complaint on file. Marland Kitchen  PCP is [facility].  Allergies  Allergen Reactions   Metformin Diarrhea    Review of Systems: Negative except as noted in the HPI.  Objective: No changes noted in today's physical examination. There were no vitals filed for this visit.  Kerry Gilbert is a pleasant 62 y.o. male in NAD. AAO x 3.  Vascular Examination: Capillary refill time <3 seconds b/l LE. Palpable pedal pulses b/l LE. Digital hair absent b/l. No pedal edema b/l. Skin temperature gradient WNL b/l. No varicosities b/l. No ischemia or gangrene noted b/l LE. No cyanosis or clubbing noted b/l LE.Marland Kitchen  Dermatological Examination: Pedal skin with normal turgor, texture and tone b/l. No open wounds. No interdigital macerations b/l. Toenails 1-5 b/l thickened, discolored, dystrophic with subungual debris. There is pain on palpation to dorsal aspect of nailplates. No hyperkeratotic nor porokeratotic lesions present on today's visit.Marland Kitchen  Neurological Examination: Protective sensation intact with 10 gram monofilament b/l LE. Vibratory sensation intact b/l LE.   Musculoskeletal Examination: Muscle strength 5/5 to all LE muscle groups b/l. HAV with bunion bilaterally and hammertoes 2-5 b/l. Pes planus deformity noted bilateral LE.  Assessment/Plan: 1. Pain due to onychomycosis of toenails of both feet   2. Type 2 diabetes mellitus with hyperglycemia, without long-term current use of insulin      -Consent given for treatment as described below: -Examined patient. -Continue supportive shoe gear daily. -Toenails 1-5 b/l were debrided in length and girth with sterile  nail nippers and dremel without iatrogenic bleeding.  -Patient/POA to call should there be question/concern in the interim.   No follow-ups on file.  Louann Sjogren, DPM

## 2023-05-03 ENCOUNTER — Ambulatory Visit: Payer: Medicare PPO | Admitting: Podiatry

## 2023-05-23 ENCOUNTER — Ambulatory Visit: Payer: Medicare PPO | Admitting: Podiatry

## 2023-06-06 ENCOUNTER — Ambulatory Visit (INDEPENDENT_AMBULATORY_CARE_PROVIDER_SITE_OTHER): Payer: Medicare PPO | Admitting: Podiatry

## 2023-06-06 DIAGNOSIS — M79674 Pain in right toe(s): Secondary | ICD-10-CM

## 2023-06-06 DIAGNOSIS — E1165 Type 2 diabetes mellitus with hyperglycemia: Secondary | ICD-10-CM | POA: Diagnosis not present

## 2023-06-06 DIAGNOSIS — B351 Tinea unguium: Secondary | ICD-10-CM

## 2023-06-06 DIAGNOSIS — M79675 Pain in left toe(s): Secondary | ICD-10-CM

## 2023-06-06 NOTE — Progress Notes (Signed)
  Subjective:  Patient ID: Kerry Gilbert, male    DOB: [birth year],  MRN: [id number]  Chief Complaint  Patient presents with   Nail Problem    RFC     62 y.o. male presents with the above complaint. History confirmed with patient. Patient presenting with pain related to dystrophic thickened elongated nails. Patient is unable to trim own nails related to nail dystrophy and/or mobility issues. Patient does have a history of T2DM.   Objective:  Physical Exam: warm, good capillary refill nail exam onychomycosis of the toenails, onycholysis, and dystrophic nails DP pulses palpable, PT pulses palpable, and protective sensation absent Left Foot:  Pain with palpation of nails due to elongation and dystrophic growth.  Right Foot: Pain with palpation of nails due to elongation and dystrophic growth.   Assessment:   1. Pain due to onychomycosis of toenails of both feet   2. Type 2 diabetes mellitus with hyperglycemia, without long-term current use of insulin (HCC)      Plan:  Patient was evaluated and treated and all questions answered.  #Onychomycosis with pain  -Nails palliatively debrided as below. -Educated on self-care  Procedure: Nail Debridement Rationale: Pain Type of Debridement: manual, sharp debridement. Instrumentation: Nail nipper, rotary burr. Number of Nails: 10  Return in about 3 months (around 09/06/2023) for [facility].         Corinna Gab, DPM [facility]

## 2023-09-11 ENCOUNTER — Ambulatory Visit: Payer: Medicare PPO | Admitting: Podiatry

## 2024-04-01 ENCOUNTER — Ambulatory Visit: Admitting: Podiatry

## 2024-04-30 ENCOUNTER — Encounter: Payer: Self-pay | Admitting: Podiatry

## 2024-04-30 ENCOUNTER — Ambulatory Visit: Admitting: Podiatry

## 2024-04-30 DIAGNOSIS — M79674 Pain in right toe(s): Secondary | ICD-10-CM | POA: Diagnosis not present

## 2024-04-30 DIAGNOSIS — E1165 Type 2 diabetes mellitus with hyperglycemia: Secondary | ICD-10-CM

## 2024-04-30 DIAGNOSIS — M79675 Pain in left toe(s): Secondary | ICD-10-CM

## 2024-04-30 DIAGNOSIS — B351 Tinea unguium: Secondary | ICD-10-CM

## 2024-04-30 NOTE — Progress Notes (Unsigned)
  Subjective:  Patient ID: Kerry Gilbert, male    DOB: [birth year],  MRN: [id number]  Chief Complaint  Patient presents with   Nail Problem    Thick painful toenails, 3 month follow up    Diabetes    Diabetic foot exam    63 y.o. male presents with the above complaint. History confirmed with patient. Patient presenting with pain related to dystrophic thickened elongated nails. Patient is unable to trim own nails related to nail dystrophy and/or mobility issues. Patient does have a history of T2DM.  He is unsure of his last A1c.  No painful calluses today.  Objective:  Physical Exam: warm, good capillary refill, pedal skin atrophic, diminished pedal hair growth nail exam onychomycosis of the toenails and dystrophic nails DP pulses palpable, PT pulses palpable, and protective sensation absent, +2 pitting edema present bilaterally Left Foot:  Pain with palpation of nails due to elongation and dystrophic growth.  Right Foot: Pain with palpation of nails due to elongation and dystrophic growth.   Gait is a propulsive and antalgic, assisted with cane  Assessment:   1. Pain due to onychomycosis of toenails of both feet   2. Type 2 diabetes mellitus with hyperglycemia, without long-term current use of insulin  [facility])      Plan:  Patient was evaluated and treated and all questions answered.  #Onychomycosis with pain  -Nails palliatively debrided as below. -Educated on self-care  Procedure: Nail Debridement Rationale: Pain Type of Debridement: manual, sharp debridement. Instrumentation: Nail nipper, rotary burr. Number of Nails: 10  Patient educated on diabetes. Discussed proper diabetic foot care and discussed risks and complications of disease. Educated patient in depth on reasons to return to the office immediately should he/she discover anything concerning or new on the feet. All questions answered. Discussed proper shoes as well.   Did recommend the use of compression stockings  for pitting edema.   Return in about 3 months (around 07/31/2024) for Diabetic Foot Care.         Ethan Saddler, DPM [facility] / [facility]

## 2024-07-30 ENCOUNTER — Encounter: Payer: Self-pay | Admitting: Podiatry

## 2024-07-30 ENCOUNTER — Ambulatory Visit: Admitting: Podiatry

## 2024-07-30 DIAGNOSIS — B353 Tinea pedis: Secondary | ICD-10-CM | POA: Diagnosis not present

## 2024-07-30 DIAGNOSIS — B351 Tinea unguium: Secondary | ICD-10-CM | POA: Diagnosis not present

## 2024-07-30 DIAGNOSIS — M79674 Pain in right toe(s): Secondary | ICD-10-CM | POA: Diagnosis not present

## 2024-07-30 DIAGNOSIS — E1165 Type 2 diabetes mellitus with hyperglycemia: Secondary | ICD-10-CM | POA: Diagnosis not present

## 2024-07-30 DIAGNOSIS — M79675 Pain in left toe(s): Secondary | ICD-10-CM | POA: Diagnosis not present

## 2024-07-30 DIAGNOSIS — I739 Peripheral vascular disease, unspecified: Secondary | ICD-10-CM | POA: Diagnosis not present

## 2024-07-30 MED ORDER — KETOCONAZOLE 2 % EX CREA: 1.0000 | TOPICAL_CREAM | Freq: Every day | CUTANEOUS | 2 refills | Status: AC

## 2024-07-30 NOTE — Progress Notes (Signed)
  Subjective:  Patient ID: Kerry Gilbert, male    DOB: 07-13-1961,  MRN: [id number]  Chief Complaint  Patient presents with   [facility] with out callous, does have very dry skin. A1c 5.6 in June No anti coag.     63 y.o. male presents with the above complaint. History confirmed with patient. Patient presenting with pain related to dystrophic thickened elongated nails. Patient is unable to trim own nails related to nail dystrophy and/or mobility issues. Patient does have a history of T2DM.  He believes his A1c was about 5.6 over the summer.  No painful calluses today.  History of stroke, PVD.  Objective:  Physical Exam: warm, good capillary refill, pedal skin atrophic, diminished pedal hair growth nail exam onychomycosis of the toenails and dystrophic nails DP pulses palpable, PT pulses nonpalpable, and protective sensation absent, +2 pitting edema present bilaterally Left Foot:  Pain with palpation of nails due to elongation and dystrophic growth.  Right Foot: Pain with palpation of nails due to elongation and dystrophic growth.  Interdigital maceration present, flaky, patchy, xerotic and erythematous skin plantar left foot.  Gait is apropulsive and antalgic, assisted with cane  Assessment:   1. Tinea pedis of both feet   2. Pain due to onychomycosis of toenails of both feet   3. Type 2 diabetes mellitus with hyperglycemia, without long-term current use of insulin  (HCC)   4. PVD (peripheral vascular disease)      Plan:  Patient was evaluated and treated and all questions answered.  # Tinea pedis - Discussed findings of tinea pedis with patient - Prescribing topical 2% ketoconazole cream to patient's pharmacy to be applied daily to the affected areas - Discussed importance of pedal hygiene as well - Follow-up in 3 to 4 weeks if not significant improvement. -I certify that this diagnosis represents a distinct and separate diagnosis that requires evaluation and treatment  separate from other procedures or diagnosis   #Onychomycosis with pain  -Nails palliatively debrided as below. -Educated on self-care - Patient should qualify for at risk footcare due to diabetes, history of stroke, PVD - ABN is on file due to patient's insurance plan.  Procedure: Nail Debridement Rationale: Pain Type of Debridement: manual, sharp debridement. Instrumentation: Nail nipper, rotary burr. Number of Nails: 10  Patient educated on diabetes. Discussed proper diabetic foot care and discussed risks and complications of disease. Educated patient in depth on reasons to return to the office immediately should he/she discover anything concerning or new on the feet. All questions answered. Discussed proper shoes as well.      Return in about 3 months (around 10/30/2024) for Diabetic Foot Care.         Ethan Saddler, DPM [facility] / [facility]

## 2024-10-29 ENCOUNTER — Ambulatory Visit: Admitting: Podiatry
